# Patient Record
Sex: Male | Born: 1965 | Race: Black or African American | Hispanic: No | Marital: Married | Smoking: Never smoker
Health system: Southern US, Community
[De-identification: ages and names within clinical notes are randomized; demographics above are authoritative.]

## PROBLEM LIST (undated history)

## (undated) DIAGNOSIS — R748 Abnormal levels of other serum enzymes: Secondary | ICD-10-CM

## (undated) DIAGNOSIS — D696 Thrombocytopenia, unspecified: Secondary | ICD-10-CM

## (undated) DIAGNOSIS — M109 Gout, unspecified: Secondary | ICD-10-CM

## (undated) DIAGNOSIS — K7689 Other specified diseases of liver: Secondary | ICD-10-CM

## (undated) DIAGNOSIS — G250 Essential tremor: Secondary | ICD-10-CM

## (undated) DIAGNOSIS — E039 Hypothyroidism, unspecified: Secondary | ICD-10-CM

## (undated) DIAGNOSIS — R569 Unspecified convulsions: Secondary | ICD-10-CM

## (undated) HISTORY — DX: Abnormal levels of other serum enzymes: R74.8

## (undated) HISTORY — PX: CATARACT EXTRACTION: SUR2

## (undated) HISTORY — DX: Essential tremor: G25.0

## (undated) HISTORY — DX: Gout, unspecified: M10.9

## (undated) HISTORY — DX: Hypothyroidism, unspecified: E03.9

## (undated) HISTORY — DX: Other specified diseases of liver: K76.89

## (undated) HISTORY — DX: Thrombocytopenia, unspecified: D69.6

---

## 1997-10-31 ENCOUNTER — Ambulatory Visit (HOSPITAL_COMMUNITY): Admission: RE | Admit: 1997-10-31 | Discharge: 1997-10-31 | Payer: Self-pay | Admitting: Neurology

## 1997-11-14 ENCOUNTER — Encounter: Admission: RE | Admit: 1997-11-14 | Discharge: 1998-02-12 | Payer: Self-pay | Admitting: Unknown Physician Specialty

## 2000-04-04 ENCOUNTER — Ambulatory Visit (HOSPITAL_COMMUNITY): Admission: RE | Admit: 2000-04-04 | Discharge: 2000-04-04 | Payer: Self-pay | Admitting: Neurology

## 2000-04-04 ENCOUNTER — Encounter: Payer: Self-pay | Admitting: Neurology

## 2004-01-13 ENCOUNTER — Encounter: Admission: RE | Admit: 2004-01-13 | Discharge: 2004-01-13 | Payer: Self-pay | Admitting: Nephrology

## 2004-01-27 ENCOUNTER — Encounter: Admission: RE | Admit: 2004-01-27 | Discharge: 2004-01-27 | Payer: Self-pay | Admitting: Nephrology

## 2005-06-12 ENCOUNTER — Encounter: Payer: Self-pay | Admitting: *Deleted

## 2006-09-29 ENCOUNTER — Ambulatory Visit (HOSPITAL_COMMUNITY): Admission: RE | Admit: 2006-09-29 | Discharge: 2006-09-29 | Payer: Self-pay | Admitting: Ophthalmology

## 2006-11-10 ENCOUNTER — Ambulatory Visit (HOSPITAL_COMMUNITY): Admission: RE | Admit: 2006-11-10 | Discharge: 2006-11-10 | Payer: Self-pay | Admitting: Ophthalmology

## 2008-04-07 ENCOUNTER — Emergency Department (HOSPITAL_BASED_OUTPATIENT_CLINIC_OR_DEPARTMENT_OTHER): Admission: EM | Admit: 2008-04-07 | Discharge: 2008-04-08 | Payer: Self-pay | Admitting: Emergency Medicine

## 2008-09-11 ENCOUNTER — Emergency Department (HOSPITAL_BASED_OUTPATIENT_CLINIC_OR_DEPARTMENT_OTHER): Admission: EM | Admit: 2008-09-11 | Discharge: 2008-09-11 | Payer: Self-pay | Admitting: Emergency Medicine

## 2008-09-11 ENCOUNTER — Ambulatory Visit: Payer: Self-pay | Admitting: Diagnostic Radiology

## 2009-03-18 IMAGING — CR DG CHEST 2V
2 series · 2 of 2 positions shown · non-contrast
Comparison: None.

CLINICAL DATA: Pre-op cataract surgery.
 CHEST - 2 VIEW - 09/29/06:

[view not recorded (1 of 2)]
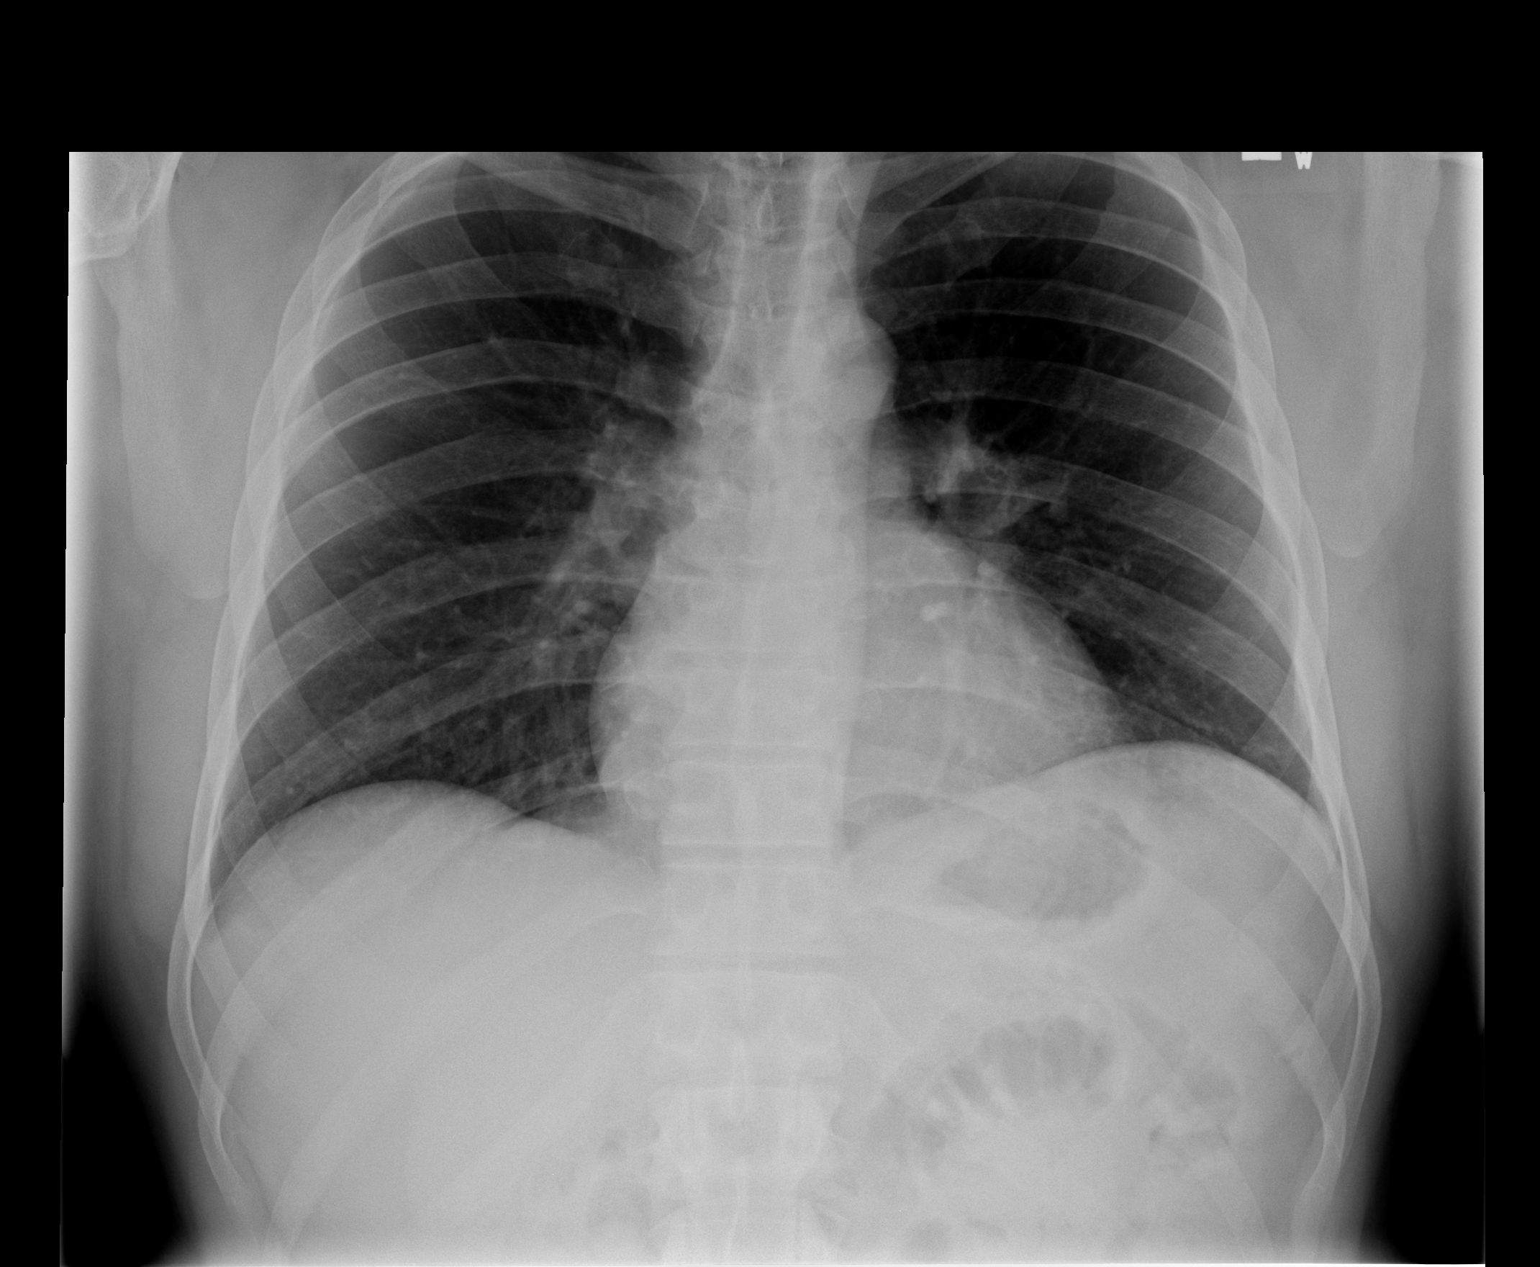

[view not recorded (2 of 2)]
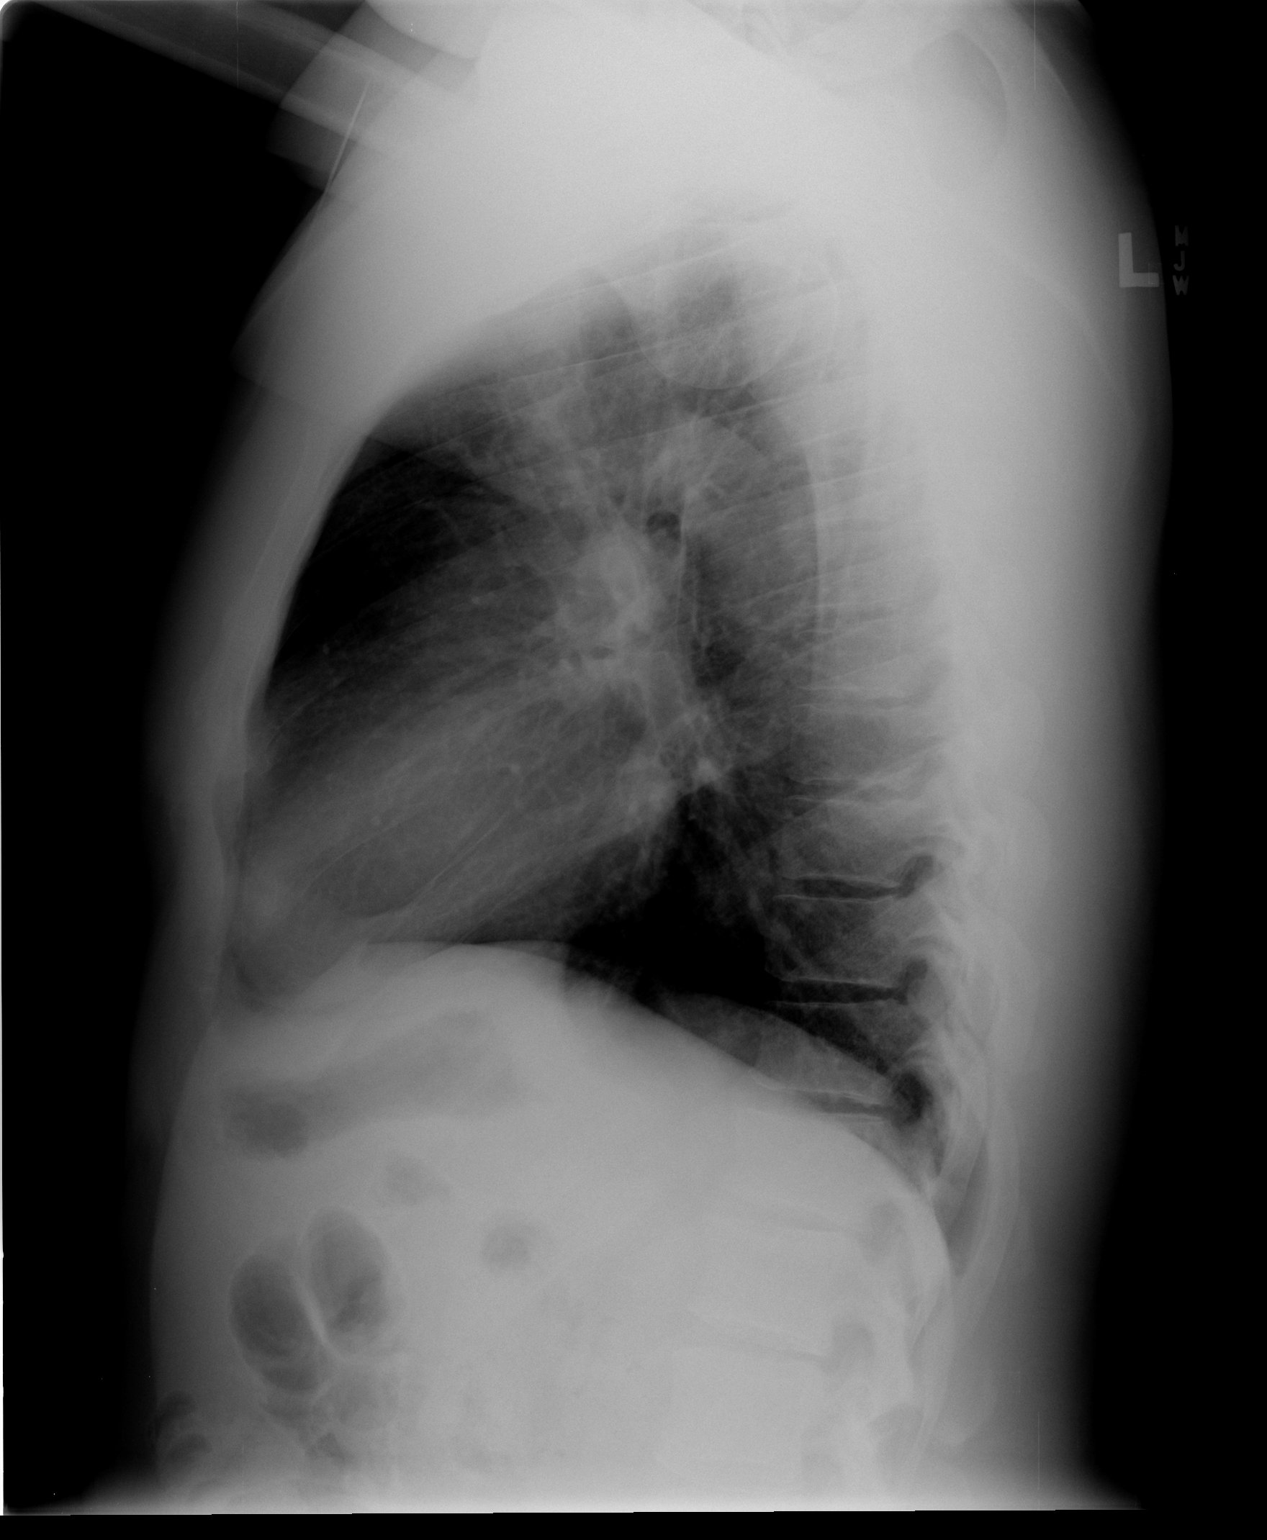

[2 of 2 positions shown; findings below may reference images not displayed]

FINDINGS: The trachea is midline.   The heart size is normal.   The lungs are clear.   No pleural fluid.
IMPRESSION: No acute findings.

## 2010-04-27 ENCOUNTER — Emergency Department (HOSPITAL_BASED_OUTPATIENT_CLINIC_OR_DEPARTMENT_OTHER)
Admission: EM | Admit: 2010-04-27 | Discharge: 2010-04-27 | Disposition: A | Discharge status: Home / Self Care | Payer: Commercial Managed Care - PPO | Attending: Emergency Medicine | Admitting: Emergency Medicine

## 2010-04-27 DIAGNOSIS — R319 Hematuria, unspecified: Secondary | ICD-10-CM | POA: Insufficient documentation

## 2010-04-27 DIAGNOSIS — E039 Hypothyroidism, unspecified: Secondary | ICD-10-CM | POA: Insufficient documentation

## 2010-04-27 DIAGNOSIS — N390 Urinary tract infection, site not specified: Secondary | ICD-10-CM | POA: Insufficient documentation

## 2010-04-27 DIAGNOSIS — N289 Disorder of kidney and ureter, unspecified: Secondary | ICD-10-CM | POA: Insufficient documentation

## 2010-04-27 LAB — BASIC METABOLIC PANEL
Chloride: 109 mEq/L (ref 96–112)
Creatinine, Ser: 1.8 mg/dL — ABNORMAL HIGH (ref 0.4–1.5)
GFR calc Af Amer: 50 mL/min — ABNORMAL LOW (ref >60)

## 2010-04-27 LAB — URINALYSIS, ROUTINE W REFLEX MICROSCOPIC
Glucose, UA: NEGATIVE mg/dL
Ketones, ur: 15 mg/dL — AB
Protein, ur: 100 mg/dL — AB

## 2010-04-27 LAB — URINE MICROSCOPIC-ADD ON

## 2010-04-28 LAB — URINE CULTURE

## 2010-05-19 ENCOUNTER — Emergency Department (HOSPITAL_BASED_OUTPATIENT_CLINIC_OR_DEPARTMENT_OTHER)
Admission: EM | Admit: 2010-05-19 | Discharge: 2010-05-19 | Disposition: A | Discharge status: Home / Self Care | Payer: 59 | Attending: Emergency Medicine | Admitting: Emergency Medicine

## 2010-05-19 DIAGNOSIS — L509 Urticaria, unspecified: Secondary | ICD-10-CM | POA: Insufficient documentation

## 2010-05-19 DIAGNOSIS — E039 Hypothyroidism, unspecified: Secondary | ICD-10-CM | POA: Insufficient documentation

## 2010-06-26 NOTE — Op Note (Signed)
NAME:  Charles Powers, MUZZY NO.:  [id number]   MEDICAL RECORD NO.:  [id number]          PATIENT TYPE:  AMB   LOCATION:  SDS                          FACILITY:  [facility]   PHYSICIAN:  Alford Highland. Rankin, M.D.   DATE OF BIRTH:  [birth year]   DATE OF PROCEDURE:  09/29/2006  DATE OF DISCHARGE:                               OPERATIVE REPORT   PREOPERATIVE DIAGNOSIS:  Dense nuclear sclerotic cataract of left eye  with passive subcapsular changes.   POSTOPERATIVE DIAGNOSIS:  Dense nuclear sclerotic cataract of left eye  with passive subcapsular changes.   PROCEDURE:  Phacoemulsification and extracapsular cataract extraction  with intraocular lens placement the left eye.   SURGEON:  Alford Highland. Rankin, M.D.   ANESTHESIA:  Local __________  control.   INDICATIONS FOR PROCEDURE:  The patient is a 45 year old man who has  profound vision loss in terms of activities of daily living at a level  of 2120/200 in the left eye base of medio passive from cataract.  The  patient understands this is an attempt to remove the cataract, as well  as to place an intraocular lens placement.  The patient understands the  risks of anesthesia including the risk of death and loss of the eye and  including but not limited to hemorrhage, infection, scarring, need for  further surgery, no change in vision, loss of vision, progression of  disease despite intervention.  Appropriate signed consent was obtained.  Patient was taken to the operating room.   DESCRIPTION OF PROCEDURE:  In the operating room, appropriate monitors  followed by mild sedation, 2% Xylocaine, no epinephrine was then placed  in the retrobulbar fashion.  Additional 5 mL was placed modified VanLint  laterally.  The left periocular region was sterilely prepped and draped  in the usual opthalmic fashion.  Lid speculum applied.  A diamond knife  was then used to create a keratotomy incision in the superonasal  quadrants.  Anterior chamber was  deepened with Viscoat.  A paracentesis  incision was made at the 2:30 position.  At this time a cystotome was  then used to engage the capsule and a circular chair capsular orexis  was started and was nearly completed.  There is a lateral rent, which  began at the 2:30 to 3 o'clock position.  A cystotome was then used to  redirect this and the capsulotomy was completed.  Hydrodissection and  delineation was then carried out in the back.  Notable findings that  there is a significant nuclear sclerotic hardening noted.  At this time  phacoemulsification was then carried out without difficulty.  Sectors  were isolated and the nucleus rotated, and were removed and finally the  central plate was elevated and removed without difficulty.  At this time  clean up with IA was carried out, although there was little of this.  This was mostly nuclear material.  At this time notable findings of  posterior subcapsular haze.  The decision was made not to engage this to  prevent and to avoid disruption of the capsule.   At this  time the Alcon SN60WF foldable lens was then placed into the  capsular bag.  Power +11.5, serial number is [id number].  This was  rotated into horizontal position and then subsequently into a position  centered at the 5 and 11 o'clock position with excellent capsular  support.  Miochol was then placed to confirm that this was in the  posterior chamber.  Thereafter a little bit of viscoelastic was left in  the anterior chamber to maintain the anterior chamber depth.   It was necessary to assist the closure and maintain closure of the  keratome incision using two separate 10-0 nylon sutures.  Knots were  rotated to the area.   At this time the wound was secure.  Subconjunctival Decadron applied.  Sterile patch and Fox shield applied.  The patient tolerated the  procedure well without complication.      Alford Highland Rankin, M.D.  Electronically Signed     GAR/MEDQ  D:   09/29/2006  T:  09/30/2006  Job:  [id number]

## 2010-06-26 NOTE — Op Note (Signed)
NAME:  TURKI, TAPANES NO.:  [id number]   MEDICAL RECORD NO.:  [id number]          PATIENT TYPE:  AMB   LOCATION:  SDS                          FACILITY:  [facility]   PHYSICIAN:  Alford Highland. Rankin, M.D.   DATE OF BIRTH:  [birth year]   DATE OF PROCEDURE:  11/10/2006  DATE OF DISCHARGE:                               OPERATIVE REPORT   PREOPERATIVE DIAGNOSIS:  Nuclear sclerotic cataract, right eye.   POSTOPERATIVE DIAGNOSIS:  Nuclear sclerotic cataract, right eye.   PROCEDURE:  Phaco, extracapsular cataract extraction with intraocular  lens placement - OD - posterior chamber.   SURGEON:  Alford Highland. Rankin, M.D.   ANESTHESIA:  Local, retrobulbar under anesthesia control.   LENS PLACED:  Alcon SN60WF, power +13.0, serial number [id number].   INDICATIONS FOR PROCEDURE:  The patient is a 45 year old man who has  profound vision loss in the left eye, high myopia, and developed a  nuclear sclerotic cataract.  The patient says he had that done in the  left eye some month and a half previously.  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  The patient understands the risks  of anesthesia including rare occurrence of death or loss of the eye  including but not limited to hemorrhage, infection, scarring, need for  further surgery, no change in vision, loss of vision and progressive  disease despite intervention.  Appropriate signed consent was obtained.   The patient was taken to the operating room.  In the operating room,  appropriate monitors followed by mild sedation.  1% Xylocaine no  epinephrine was then placed in retrobulbar fashion, 5 mL, followed by  additional 5 mL laterally in the fashion of  modified Darel Hong.  The  right periocular region was sterilely prepped and draped in usual  sterile fashion.  Lid speculum applied.  Diamond blade knife was then  used to create a keratotomy  incision superotemporally.  Anterior chamber  was deepened with Viscoat.  A cystotome needle was bent and  capsulorrhexis was fashioned with this is in continuous circular tear  fashion without difficulty.  The phacoemulsification tip was then placed  after hydrodissection and delineation had been carried out in the bag  with BSS.  Lens removal carried out uneventfully.  Separate paracentesis  incision was then fashioned as for placement of the nucleus rotator.  The nucleus was removed in segments, fractured in half, removed in  quadrants, and all nuclear material was removed in this fashion.  Irrigation-aspiration and then clean up of the cortex was then carried  out.  A bent IA instrument was then used superiorly.  All cortical  fragments had been removed.  At this time, the anterior chamber and the  capsule was then deepened with Viscoat.  At this time, the intraocular  lens was placed through the incision with the lens delivery system and  was placed in the capsular bag and rotated, with  excellent positioning  in the capsular bag.  IA  was then carried out to remove  the remnants of Viscoat.  A safety 10-0  nylon suture was placed.  No complications occurred.  Subconjunctival  Decadron placed inferiorly.  Soft patch and Fox shield applied.   The patient tolerated the procedure without complication.      Alford Highland Rankin, M.D.  Electronically Signed     GAR/MEDQ  D:  11/10/2006  T:  11/10/2006  Job:  [id number]

## 2010-11-22 LAB — BASIC METABOLIC PANEL
CO2: 23
Chloride: 111
Glucose, Bld: 91
Potassium: 4.1
Sodium: 142

## 2010-11-22 LAB — CBC
HCT: 45.1
Hemoglobin: 15.1
MCHC: 33.5
MCV: 88.1
RDW: 13.6

## 2010-11-23 LAB — BASIC METABOLIC PANEL
BUN: 18
Calcium: 9.3
GFR calc non Af Amer: 51 — ABNORMAL LOW
Glucose, Bld: 89

## 2010-11-23 LAB — PROTIME-INR: INR: 1

## 2010-11-23 LAB — CBC
MCHC: 34.4
Platelets: 123 — ABNORMAL LOW
RDW: 12.9

## 2010-11-23 LAB — APTT: aPTT: 32

## 2012-09-26 ENCOUNTER — Encounter (HOSPITAL_BASED_OUTPATIENT_CLINIC_OR_DEPARTMENT_OTHER): Payer: Self-pay | Admitting: *Deleted

## 2012-09-26 ENCOUNTER — Emergency Department (HOSPITAL_BASED_OUTPATIENT_CLINIC_OR_DEPARTMENT_OTHER)
Admission: EM | Admit: 2012-09-26 | Discharge: 2012-09-26 | Disposition: A | Discharge status: Home / Self Care | Payer: 59 | Attending: Emergency Medicine | Admitting: Emergency Medicine

## 2012-09-26 DIAGNOSIS — R229 Localized swelling, mass and lump, unspecified: Secondary | ICD-10-CM

## 2012-09-26 DIAGNOSIS — R569 Unspecified convulsions: Secondary | ICD-10-CM | POA: Insufficient documentation

## 2012-09-26 DIAGNOSIS — M799 Soft tissue disorder, unspecified: Secondary | ICD-10-CM | POA: Insufficient documentation

## 2012-09-26 DIAGNOSIS — Z79899 Other long term (current) drug therapy: Secondary | ICD-10-CM | POA: Insufficient documentation

## 2012-09-26 HISTORY — DX: Unspecified convulsions: R56.9

## 2012-09-26 NOTE — ED Provider Notes (Signed)
CSN: [id number]     Arrival date & time 09/26/12  2058 History     First MD Initiated Contact with Patient 09/26/12 2115     Chief Complaint  Patient presents with  . Recurrent Skin Infections   (Consider location/radiation/quality/duration/timing/severity/associated sxs/prior Treatment) HPI Comments: Patient is otherwise healthy 47 year old man with a history of recurrent skin infections - he comes in because he has noticed an nonpainful knot behind his left ear.  He reports no drainage, redness but just some swelling to the area.  Denies any injury to the area as well.  Patient is a 47 y.o. male presenting with rash. The history is provided by the patient. No language interpreter was used.  Rash Location:  Head/neck Head/neck rash location:  Scalp and L ear Quality: swelling   Quality: not blistering, not bruising, not burning, not draining, not dry, not itchy, not painful, not peeling, not red, not scaling and not weeping   Severity:  Mild Onset quality:  Gradual Duration:  2 days Timing:  Constant Progression:  Worsening Chronicity:  New Relieved by:  Nothing Worsened by:  Nothing tried Ineffective treatments:  None tried Associated symptoms: no abdominal pain, no diarrhea, no fever, no headaches, no induration, no myalgias, no nausea, no periorbital edema, no throat swelling, no tongue swelling, not vomiting and not wheezing     Past Medical History  Diagnosis Date  . Seizures    History reviewed. No pertinent past surgical history. History reviewed. No pertinent family history. History  Substance Use Topics  . Smoking status: Never Smoker   . Smokeless tobacco: Not on file  . Alcohol Use: No    Review of Systems  Constitutional: Negative for fever.  Respiratory: Negative for wheezing.   Gastrointestinal: Negative for nausea, vomiting, abdominal pain and diarrhea.  Musculoskeletal: Negative for myalgias.  Skin: Positive for rash.  Neurological: Negative for  headaches.  All other systems reviewed and are negative.    Allergies  Review of patient's allergies indicates no known allergies.  Home Medications   Current Outpatient Rx  Name  Route  Sig  Dispense  Refill  . divalproex (DEPAKOTE) 500 MG DR tablet   Oral   Take 1,000 mg by mouth 1 day or 1 dose.         . levothyroxine (SYNTHROID, LEVOTHROID) 75 MCG tablet   Oral   Take 75 mcg by mouth daily before breakfast.         . topiramate (TOPAMAX) 200 MG tablet   Oral   Take 100 mg by mouth 2 (two) times daily.          BP 120/78  Pulse 70  Temp(Src) 98.3 F (36.8 C) (Oral)  Resp 18  Ht 5\' 5"  (1.651 m)  Wt 200 lb (90.719 kg)  BMI 33.28 kg/m2  SpO2 99% Physical Exam  Nursing note and vitals reviewed. Constitutional: He is oriented to person, place, and time. He appears well-developed and well-nourished. No distress.  HENT:  Head: Normocephalic and atraumatic.  Right Ear: External ear normal.  Nose: Nose normal.  Mouth/Throat: Oropharynx is clear and moist. No oropharyngeal exudate.  Small soft tissue swelling post auricular - no mastoid process tenderness - mild induration without fluctuance  Eyes: Conjunctivae are normal. Pupils are equal, round, and reactive to light. No scleral icterus.  Neck: Normal range of motion. Neck supple.  Cardiovascular: Normal rate, regular rhythm and normal heart sounds.  Exam reveals no gallop and no friction rub.  No murmur heard. Pulmonary/Chest: Effort normal and breath sounds normal. No respiratory distress. He has no wheezes.  Abdominal: Soft. Bowel sounds are normal. He exhibits no distension. There is no tenderness.  Musculoskeletal: Normal range of motion. He exhibits no edema and no tenderness.  Lymphadenopathy:    He has no cervical adenopathy.  Neurological: He is alert and oriented to person, place, and time. No cranial nerve deficit.  Skin: Skin is warm and dry. No rash noted. No erythema. No pallor.  Psychiatric: He  has a normal mood and affect. His behavior is normal. Judgment and thought content normal.    ED Course   Procedures (including critical care time)  Labs Reviewed - No data to display No results found. 1. Soft tissue swelling     MDM  Patient with small amount of left post auricular soft tissue swelling - this is non=painful so will plan on warm compresses and watchful waiting.  Patient and family in agreement with plan.  Izola Price Marisue Humble, [place] 09/26/12 2203

## 2012-09-26 NOTE — ED Notes (Signed)
Patient states that he has a "boil" on the back of his left ear and just wants it checked out.  Denies any pain.

## 2012-09-26 NOTE — ED Provider Notes (Signed)
Medical screening examination/treatment/procedure(s) were performed by non-physician practitioner and as supervising physician I was immediately available for consultation/collaboration.   Charles B. Bernette Mayers, MD 09/26/12 2245

## 2012-11-04 ENCOUNTER — Telehealth: Payer: Self-pay | Admitting: Neurology

## 2012-11-06 NOTE — Telephone Encounter (Signed)
Called patient to find out what lab order needed. No answer.

## 2012-11-10 NOTE — Telephone Encounter (Signed)
I called and LMVM for wife at number left 11-09-12 re: labs requested.   Will call back if needed.

## 2012-11-19 ENCOUNTER — Telehealth: Payer: Self-pay | Admitting: Neurology

## 2012-11-20 NOTE — Telephone Encounter (Signed)
Spoke to spouse. The patient is a formed Dr. Sandria Manly patient.  Answered questions about patient's new assignment to Dr. Marjory Lies and how she could have PCP labs fwd to our office.

## 2012-12-14 ENCOUNTER — Telehealth: Payer: Self-pay | Admitting: *Deleted

## 2012-12-14 NOTE — Telephone Encounter (Signed)
Calling for appt

## 2012-12-15 ENCOUNTER — Encounter (INDEPENDENT_AMBULATORY_CARE_PROVIDER_SITE_OTHER): Payer: Self-pay

## 2012-12-15 ENCOUNTER — Ambulatory Visit (INDEPENDENT_AMBULATORY_CARE_PROVIDER_SITE_OTHER): Payer: 59 | Admitting: Diagnostic Neuroimaging

## 2012-12-15 ENCOUNTER — Encounter: Payer: Self-pay | Admitting: Diagnostic Neuroimaging

## 2012-12-15 VITALS — BP 108/72 | HR 62 | Ht 65.0 in | Wt 203.0 lb

## 2012-12-15 DIAGNOSIS — G40909 Epilepsy, unspecified, not intractable, without status epilepticus: Secondary | ICD-10-CM

## 2012-12-15 NOTE — Patient Instructions (Signed)
Continue current medications. 

## 2012-12-15 NOTE — Progress Notes (Signed)
[facility]  PATIENT: Charles Powers DOB: Dec 11, 1965  REFERRING CLINICIAN:  HISTORY FROM: patient and wife REASON FOR VISIT: follow up   HISTORICAL  CHIEF COMPLAINT:  Chief Complaint  Patient presents with  . Follow-up    Prior Dr. Sandria Manly pt, Epilepsy    HISTORY OF PRESENT ILLNESS:   UPDATE 12/15/12: Since last visit patient is doing well. No further seizures. Last seizure was in July 2010. Patient is on Depakote ER 500 mg twice a day plus Depakote ER 250mg  at bedtime. He is also on Topamax 100 mg twice a day. Patient continues with fatigue, daytime sleepiness, bilateral upper extremity tremor.  PRIOR HPI (03/31/12, Dr. Sandria Manly): 47 year old right-handed African American married male from [place], [place] with a history of early morning nocturnal generalized major motor seizures beginning in April 1994. At that time his neurologic examination, sleep deprived EEGs, and  MRI study of brain were normal. After having 2 seizures and normal EEGs he was placed on Dilantin medication but developed elevated liver function tests and in May of 1996 was changed to Depakote. His last CT 10/31/1997 was normal. 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 His weight ballooned on Depakote, 45 pounds to 211 pounds, then back down to 189 and now 211.5 pounds. He had episodes suggestive of myoclonic jerks. In 2002 he was begun on Topamax in addition to the Depakote. He has had generalized major motor seizures, 2 in 1994, one 11/04/1996, one 02/10/2000, one 03/18/2000,  one 2008, and one 09/07/2008. On Topamax he developed elevated creatinine and was seen by Dr. Geanie Berlin nephrologist. His last MRI study of the brain 04/04/2000  was normal with and without contrast. His last DEXA scan  09/12/2005  was normal. Blood studies 09/21/10=  WBC 7600, Hemoglobin 15.5, PLTC 91 K, sodium 141, potassium 4.7, chloride 111, CO2 28, BUN 16, Creatinine 1.6. Glucose 78, Valproic acid level 114.2, TSH  6.83. Repeat TSH 11/02/10, 3.08. CT scan of the brain without contrast was normal 09/07/2008. Medication schedule is Depakote ER brand name 500 mg 2 twice daily and  Depakote ER 250 mg one in the evening,Topamax brand name 100  milligrams one twice daily, and Synthroid 25 mcg once daily. Dr.Coladonato  does not feel his elevated creatinine is due to Topamax. patient denies any new symptoms. He denies dj vu, memory loss, hallucinations, delusions, depression, anxiety, or apathy . He does not exercise on a regular basis. Last blood levels 07/24/2011 were topiramate 7.6 and valproic acid 128. 09/19/2011  CBC and CMP were normal except platelet count 102K. He snores at night and he underwent polysomnogram 9/10/200  which was normal.   REVIEW OF SYSTEMS: Full 14 system review of systems performed and notable only for tremor.  ALLERGIES: No Known Allergies  HOME MEDICATIONS: Outpatient Prescriptions Prior to Visit  Medication Sig Dispense Refill  . divalproex (DEPAKOTE) 500 MG DR tablet Take 1,000 mg by mouth 1 day or 1 dose.      . levothyroxine (SYNTHROID, LEVOTHROID) 75 MCG tablet Take 75 mcg by mouth daily before breakfast.      . topiramate (TOPAMAX) 200 MG tablet Take 100 mg by mouth 2 (two) times daily.       No facility-administered medications prior to visit.    PAST MEDICAL HISTORY: Past Medical History  Diagnosis Date  . Seizures   . Essential tremor   . Hypothyroidism   . Elevated creatine kinase   . Temporary low platelet count   .  Liver dysfunction     on Dilantin    PAST SURGICAL HISTORY: Past Surgical History  Procedure Laterality Date  . Cataract extraction      FAMILY HISTORY: Family History  Problem Relation Age of Onset  . Diabetes Mother   . Hypertension Mother   . Pancreatic cancer Father   . Epilepsy    . Cancer      SOCIAL HISTORY:  History   Social History  . Marital Status: Married    Spouse Name: Rosey Bath    Number of Children: 0  . Years of  Education: BA   Occupational History  .  [employer]   Social History Main Topics  . Smoking status: Never Smoker   . Smokeless tobacco: Never Used  . Alcohol Use: No  . Drug Use: No  . Sexual Activity: Not on file   Other Topics Concern  . Not on file   Social History Narrative   Patient lives at home with his spouse.   Caffeine Use: occasionally     PHYSICAL EXAM  Filed Vitals:   12/15/12 1342  BP: 108/72  Pulse: 62  Height: 5\' 5"  (1.651 m)  Weight: 203 lb (92.08 kg)    Not recorded    Body mass index is 33.78 kg/(m^2).  GENERAL EXAM: Patient is in no distress  CARDIOVASCULAR: Regular rate and rhythm, no murmurs, no carotid bruits  NEUROLOGIC: MENTAL STATUS: awake, alert, language fluent, comprehension intact, naming intact CRANIAL NERVE: no papilledema on fundoscopic exam, pupils equal and reactive to light, visual fields full to confrontation, extraocular muscles intact, no nystagmus, facial sensation and strength symmetric, uvula midline, shoulder shrug symmetric, tongue midline. MOTOR: normal bulk and tone, full strength in the BUE, BLE; POSTURAL TREMOR OF BUE.  SENSORY: normal and symmetric to light touch, temperature, vibration COORDINATION: finger-nose-finger, fine finger movements normal REFLEXES: deep tendon reflexes present and symmetric GAIT/STATION: narrow based gait; romberg is negative   DIAGNOSTIC DATA (LABS, IMAGING, TESTING) - I reviewed patient records, labs, notes, testing and imaging myself where available.  Lab Results  Component Value Date   WBC 4.2 11/10/2006   HGB 15.1 11/10/2006   HCT 45.1 11/10/2006   MCV 88.1 11/10/2006   PLT 117* 11/10/2006      Component Value Date/Time   NA 144 04/27/2010 0341   K 4.3 04/27/2010 0341   CL 109 04/27/2010 0341   CO2 25 04/27/2010 0341   GLUCOSE 104* 04/27/2010 0341   BUN 19 04/27/2010 0341   CREATININE 1.8* 04/27/2010 0341   CALCIUM 9.3 04/27/2010 0341   GFRNONAA 41* 04/27/2010 0341    GFRAA  Value: 50        The eGFR has been calculated using the MDRD equation. This calculation has not been validated in all clinical situations. eGFR's persistently <60 mL/min signify possible Chronic Kidney Disease.* 04/27/2010 0341   No results found for this basename: CHOL, HDL, LDLCALC, LDLDIRECT, TRIG, CHOLHDL   No results found for this basename: HGBA1C   No results found for this basename: VITAMINB12   No results found for this basename: TSH    09/15/08 EEG - right central temporal epileptiform activity C4, T4   ASSESSMENT AND PLAN  46 y.o. 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 Doing well on Depakote ER + Topamax.  PLAN: - continue current medications - review labs - repeat sleep study (witnessed apneas, snoring, obesity and day time fatigue)  Return in about 6 months (around 06/14/2013) for with Heide Guile or Lilliemae Fruge.    Suanne Marker, MD 12/15/2012, 2:34 PM Certified in Neurology, Neurophysiology and Neuroimaging  [facility] [address], [address] [place], [place] [address] [phone]

## 2013-01-05 ENCOUNTER — Telehealth: Payer: Self-pay | Admitting: Diagnostic Neuroimaging

## 2013-01-06 ENCOUNTER — Telehealth: Payer: Self-pay | Admitting: *Deleted

## 2013-01-06 MED ORDER — DIVALPROEX SODIUM ER 250 MG PO TB24: 250.0000 mg | ORAL_TABLET | Freq: Every evening | ORAL | Status: DC

## 2013-01-06 MED ORDER — TOPIRAMATE 100 MG PO TABS: 100.0000 mg | ORAL_TABLET | Freq: Two times a day (BID) | ORAL | Status: DC

## 2013-01-06 MED ORDER — DIVALPROEX SODIUM ER 500 MG PO TB24: 1000.0000 mg | ORAL_TABLET | Freq: Two times a day (BID) | ORAL | Status: DC

## 2013-01-06 NOTE — Telephone Encounter (Signed)
Although this message was typed yesterday, the person did not forward the message.  Message was forwarded today.  Rx's have been sent.  I called the patient.  They are aware.

## 2013-01-06 NOTE — Telephone Encounter (Signed)
I called pt and was not able to get pt at home or cell.  Lab results received.  Dr. Frances Furbish consulted re: depakote level 131.  Pts sx of fatigue, daytime sleepiness, bil. upper ext. tremor.  Will try to reach another time.

## 2013-01-11 ENCOUNTER — Encounter: Payer: Self-pay | Admitting: *Deleted

## 2013-01-11 NOTE — Telephone Encounter (Signed)
Attempted to call pt cell # (VM full). And home # is fax.?  Mailed letter re: pt attempted to reach, please call for lab results and recommendations.

## 2013-03-08 ENCOUNTER — Telehealth: Payer: Self-pay | Admitting: Diagnostic Neuroimaging

## 2013-03-08 NOTE — Telephone Encounter (Signed)
Patient has been scheduled for sooner appt w/ Dr Marjory LiesPenumalli

## 2013-03-08 NOTE — Telephone Encounter (Signed)
Called stating that Pt's tremors in hands have gotten worse.  They stated that some days are better than others but lately has had a hard time writing or counting money.  Asked if they could get in to see Dr. Marjory LiesPenumalli sometime this week.  They are available all day today, after 3 PM on Tuesday and Wednesday morning.  Please call.  Thank you.

## 2013-03-11 ENCOUNTER — Ambulatory Visit (INDEPENDENT_AMBULATORY_CARE_PROVIDER_SITE_OTHER): Payer: BC Managed Care – PPO | Admitting: Diagnostic Neuroimaging

## 2013-03-11 ENCOUNTER — Encounter (INDEPENDENT_AMBULATORY_CARE_PROVIDER_SITE_OTHER): Payer: Self-pay

## 2013-03-11 ENCOUNTER — Encounter: Payer: Self-pay | Admitting: Diagnostic Neuroimaging

## 2013-03-11 VITALS — BP 117/82 | HR 89 | Ht 65.0 in | Wt 203.0 lb

## 2013-03-11 DIAGNOSIS — R259 Unspecified abnormal involuntary movements: Secondary | ICD-10-CM

## 2013-03-11 DIAGNOSIS — R251 Tremor, unspecified: Secondary | ICD-10-CM

## 2013-03-11 DIAGNOSIS — G40909 Epilepsy, unspecified, not intractable, without status epilepticus: Secondary | ICD-10-CM

## 2013-03-11 NOTE — Progress Notes (Signed)
[facility]  PATIENT: Charles Powers DOB: 10/23/1965  REFERRING CLINICIAN:  HISTORY FROM: patient and wife REASON FOR VISIT: follow up   HISTORICAL  CHIEF COMPLAINT:  Chief Complaint  Patient presents with  . Follow-up    tremors    HISTORY OF PRESENT ILLNESS:   UPDATE 03/11/13: Since last visit, has been having more tremors, with handwriting and eating. This fluctuates. No clear pattern. 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   UPDATE 12/15/12: Since last visit patient is doing well. No further seizures. Last seizure was in July 2010. Patient is on Depakote ER 500 mg twice a day plus Depakote ER $RemoveBef'250mg'oWIwcNUEpF$  at bedtime. He is also on Topamax 100 mg twice a day. Patient continues with fatigue, daytime sleepiness, bilateral upper extremity tremor.  PRIOR HPI (03/31/12, Dr. Erling Cruz): 48 year old right-handed African American married male from [place], [place] with a history of early morning nocturnal generalized major motor seizures beginning in April 1994. At that time his neurologic examination, sleep deprived EEGs, and  MRI study of brain were normal. After having 2 seizures and normal EEGs he was placed on Dilantin medication but developed elevated liver function tests and in May of 1996 was changed to Depakote. His last CT 10/31/1997 was normal. 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 His weight ballooned on Depakote, 45 pounds to 211 pounds, then back down to 189 and now 211.5 pounds. He had episodes suggestive of myoclonic jerks. In 2002 he was begun on Topamax in addition to the Depakote. He has had generalized major motor seizures, 2 in 1994, one 11/04/1996, one 02/10/2000, one 03/18/2000,  one 2008, and one 09/07/2008. On Topamax he developed elevated creatinine and was seen by Dr. Servando Salina nephrologist. His last MRI study of the brain 04/04/2000  was normal with and without contrast. His last  DEXA scan  09/12/2005  was normal. Blood studies 09/21/10=  WBC 7600, Hemoglobin 15.5, PLTC 91 K, sodium 141, potassium 4.7, chloride 111, CO2 28, BUN 16, Creatinine 1.6. Glucose 78, Valproic acid level 114.2, TSH 6.83. Repeat TSH 11/02/10, 3.08. CT scan of the brain without contrast was normal 09/07/2008. Medication schedule is Depakote ER brand name 500 mg 2 twice daily and  Depakote ER 250 mg one in the evening,Topamax brand name 100  milligrams one twice daily, and Synthroid 25 mcg once daily. Dr.Coladonato  does not feel his elevated creatinine is due to Topamax. patient denies any new symptoms. He denies dj vu, memory loss, hallucinations, delusions, depression, anxiety, or apathy . He does not exercise on a regular basis. Last blood levels 07/24/2011 were topiramate 7.6 and valproic acid 128. 09/19/2011  CBC and CMP were normal except platelet count 102K. He snores at night and he underwent polysomnogram 9/10/200  which was normal.   REVIEW OF SYSTEMS: Full 14 system review of systems performed and notable only for tremor.  ALLERGIES: No Known Allergies  HOME MEDICATIONS: Outpatient Prescriptions Prior to Visit  Medication Sig Dispense Refill  . Calcium Carbonate-Vitamin D (CALCIUM-VITAMIN D) 500-200 MG-UNIT per tablet Take 1 tablet by mouth 2 (two) times daily with a meal.      . divalproex (DEPAKOTE ER) 500 MG 24 hr tablet Take 2 tablets (1,000 mg total) by mouth 2 (two) times daily.  360 tablet  1  . levothyroxine (SYNTHROID, LEVOTHROID) 75 MCG tablet Take 75 mcg by mouth daily before breakfast.      .  topiramate (TOPAMAX) 100 MG tablet Take 1 tablet (100 mg total) by mouth 2 (two) times daily.  180 tablet  1  . divalproex (DEPAKOTE ER) 250 MG 24 hr tablet Take 1 tablet (250 mg total) by mouth every evening.  90 tablet  1   No facility-administered medications prior to visit.    PAST MEDICAL HISTORY: Past Medical History  Diagnosis Date  . Seizures   . Essential tremor   . Hypothyroidism    . Elevated creatine kinase   . Temporary low platelet count   . Liver dysfunction     on Dilantin    PAST SURGICAL HISTORY: Past Surgical History  Procedure Laterality Date  . Cataract extraction      FAMILY HISTORY: Family History  Problem Relation Age of Onset  . Diabetes Mother   . Hypertension Mother   . Pancreatic cancer Father   . Epilepsy    . Cancer      SOCIAL HISTORY:  History   Social History  . Marital Status: Married    Spouse Name: Helene Kelp    Number of Children: 0  . Years of Education: BA   Occupational History  .  [employer]   Social History Main Topics  . Smoking status: Never Smoker   . Smokeless tobacco: Never Used  . Alcohol Use: No  . Drug Use: No  . Sexual Activity: Not on file   Other Topics Concern  . Not on file   Social History Narrative   Patient lives at home with his spouse.   Caffeine Use: occasionally     PHYSICAL EXAM  Filed Vitals:   03/11/13 1455  BP: 117/82  Pulse: 89  Height: $Remove'5\' 5"'qfHOUpI$  (1.651 m)  Weight: 203 lb (92.08 kg)    Not recorded    Body mass index is 33.78 kg/(m^2).  GENERAL EXAM: Patient is in no distress  CARDIOVASCULAR: Regular rate and rhythm, no murmurs, no carotid bruits  NEUROLOGIC: MENTAL STATUS: awake, alert, language fluent, comprehension intact, naming intact CRANIAL NERVE: no papilledema on fundoscopic exam, pupils equal and reactive to light, visual fields full to confrontation, extraocular muscles intact, no nystagmus, facial sensation and strength symmetric, uvula midline, shoulder shrug symmetric, tongue midline. MOTOR: normal bulk and tone, full strength in the BUE, BLE; POSTURAL AND ACTION TREMOR OF BUE.  SENSORY: normal and symmetric to light touch, temperature, vibration COORDINATION: finger-nose-finger, fine finger movements normal REFLEXES: deep tendon reflexes present and symmetric GAIT/STATION: narrow based gait; romberg is negative   DIAGNOSTIC DATA (LABS,  IMAGING, TESTING) - I reviewed patient records, labs, notes, testing and imaging myself where available.  Lab Results  Component Value Date   WBC 4.2 11/10/2006   HGB 15.1 11/10/2006   HCT 45.1 11/10/2006   MCV 88.1 11/10/2006   PLT 117* 11/10/2006      Component Value Date/Time   NA 144 04/27/2010 0341   K 4.3 04/27/2010 0341   CL 109 04/27/2010 0341   CO2 25 04/27/2010 0341   GLUCOSE 104* 04/27/2010 0341   BUN 19 04/27/2010 0341   CREATININE 1.8* 04/27/2010 0341   CALCIUM 9.3 04/27/2010 0341   GFRNONAA 41* 04/27/2010 0341   GFRAA  Value: 50        The eGFR has been calculated using the MDRD equation. This calculation has not been validated in all clinical situations. eGFR's persistently <60 mL/min signify possible Chronic Kidney Disease.* 04/27/2010 0341   No results found for this basename: CHOL,  HDL,  LDLCALC,  LDLDIRECT,  TRIG,  CHOLHDL   No results found for this basename: HGBA1C   No results found for this basename: VITAMINB12   No results found for this basename: TSH    09/15/08 EEG - right central temporal epileptiform activity C4, T4   ASSESSMENT AND PLAN  48 y.o. 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 Doing well on Depakote ER + Topamax from seizure control standpoint, but having increasing tremors. Recent VPA level 131 (11/27/12). Previously was 128 (07/24/11).   PLAN: - stop nightime depakote $RemoveBeforeDEI'250mg'FdkJcGBOrMIBAOEp$  qhs - continue depakote $RemoveBeforeDEI'1000mg'ZkyJDSBEwyutcmTn$  BID + TPX $Rem'100mg'BVlj$  BID - check labs in 1 month   Return in about 6 months (around 09/08/2013) for with Charlott Holler or Penumalli.    Penni Bombard, MD 0/92/3300, 7:62 PM Certified in Neurology, Neurophysiology and Neuroimaging  [facility] [address], [place], [place] [address] [phone]

## 2013-03-15 DIAGNOSIS — R251 Tremor, unspecified: Secondary | ICD-10-CM | POA: Insufficient documentation

## 2013-03-15 DIAGNOSIS — G40909 Epilepsy, unspecified, not intractable, without status epilepticus: Secondary | ICD-10-CM | POA: Insufficient documentation

## 2013-05-18 ENCOUNTER — Telehealth: Payer: Self-pay | Admitting: Diagnostic Neuroimaging

## 2013-05-18 NOTE — Telephone Encounter (Signed)
Called pt's wife Rosey Batheresa to inform her that we have not received the lab work results and if she could have them faxed the results over to us that we would appreciate it. Pt's wife verbalized understanding.

## 2013-05-18 NOTE — Telephone Encounter (Signed)
Pt's wife, Rosey Batheresa, called.  She stated that her husband had lab work done on 05-08-13 at [facility]. She asked if we have received the results from them. If we have they would like someone to call them with the results.  If we don't have them, she asked if we could request them. She stated that Dr. Marjory LiesPenumalli gave them an order for the lab work so they could go to a different location to have the lab work performed.  Please call her at [phone] to discuss.  Thank you

## 2013-05-20 ENCOUNTER — Telehealth: Payer: Self-pay | Admitting: Diagnostic Neuroimaging

## 2013-05-20 NOTE — Telephone Encounter (Signed)
Pt's wife, Rosey Batheresa, called in.  She stated that she called the hospital on Tuesday and they were faxing over the lab work results.  She wanted to make sure that we have received them and if someone could call her to give her those results. Please reference previous telephone message dated 05-18-13.  Thank you

## 2013-05-24 NOTE — Telephone Encounter (Addendum)
Called [facility]. Requested labs faxed again. Received lab results. Valporic(96.7) and Ammonia (55) levels normal. Dr. Marjory LiesPenumalli recommended repeat CBC approx. 06/11/13. Explained to spouse, agreed.

## 2013-06-14 ENCOUNTER — Ambulatory Visit: Payer: 59 | Admitting: Diagnostic Neuroimaging

## 2013-06-15 ENCOUNTER — Telehealth: Payer: Self-pay | Admitting: Diagnostic Neuroimaging

## 2013-06-15 NOTE — Telephone Encounter (Signed)
[facility] the [facility], [facility]  He ran the Rx's and said the reject only says the patient needs to contact the ins.  He says they likely want the patient to be on generic medication.  I contacted the ins and submitted all clinical info to try and see if they could grant an exception.  I called the patient back.  Spoke with Charles Powers.  Gave her the phone number for assistance for Depakote [phone]) and Topamax [phone]).  She will contact them and call us back if needed. She was very appreciative for this info.

## 2013-06-15 NOTE — Telephone Encounter (Signed)
Patient's wife calling to state that patient is having difficulties with insurance and his Depakote and Topamax medications. Patient's wife states the medications are more expensive now and they are requesting samples of both until they are able to sort out insurance issues. Please call and advise.

## 2013-06-16 ENCOUNTER — Telehealth: Payer: Self-pay | Admitting: Diagnostic Neuroimaging

## 2013-06-16 NOTE — Telephone Encounter (Signed)
I spoke with Ms. Charles Powers  She wanted to let me know the programs said they should be able to help them get the meds.

## 2013-06-16 NOTE — Telephone Encounter (Signed)
Pt's wife Rosey Batheresa called concerning phone call from yesterday, she would like for someone to call her back concerning this matter. Thanks

## 2013-08-09 ENCOUNTER — Other Ambulatory Visit: Payer: Self-pay

## 2013-08-09 MED ORDER — DIVALPROEX SODIUM ER 500 MG PO TB24: 1000.0000 mg | ORAL_TABLET | Freq: Two times a day (BID) | ORAL | Status: DC

## 2013-08-09 MED ORDER — TOPIRAMATE 100 MG PO TABS: 100.0000 mg | ORAL_TABLET | Freq: Two times a day (BID) | ORAL | Status: DC

## 2013-09-08 ENCOUNTER — Ambulatory Visit: Payer: PRIVATE HEALTH INSURANCE | Admitting: Diagnostic Neuroimaging

## 2013-10-06 ENCOUNTER — Ambulatory Visit: Payer: BC Managed Care – PPO | Admitting: Diagnostic Neuroimaging

## 2013-10-12 ENCOUNTER — Other Ambulatory Visit: Payer: Self-pay | Admitting: Diagnostic Neuroimaging

## 2013-10-26 ENCOUNTER — Ambulatory Visit (INDEPENDENT_AMBULATORY_CARE_PROVIDER_SITE_OTHER): Payer: PRIVATE HEALTH INSURANCE | Admitting: Diagnostic Neuroimaging

## 2013-10-26 ENCOUNTER — Encounter (INDEPENDENT_AMBULATORY_CARE_PROVIDER_SITE_OTHER): Payer: Self-pay

## 2013-10-26 ENCOUNTER — Encounter: Payer: Self-pay | Admitting: Diagnostic Neuroimaging

## 2013-10-26 VITALS — BP 120/82 | HR 64 | Ht 65.0 in | Wt 201.6 lb

## 2013-10-26 DIAGNOSIS — G40909 Epilepsy, unspecified, not intractable, without status epilepticus: Secondary | ICD-10-CM

## 2013-10-26 NOTE — Progress Notes (Signed)
[facility]  PATIENT: Charles Powers DOB: 08/22/1965  REFERRING CLINICIAN:  HISTORY FROM: patient and wife REASON FOR VISIT: follow up   HISTORICAL  CHIEF COMPLAINT:  Chief Complaint  Patient presents with  . Follow-up    Sz d/o    HISTORY OF PRESENT ILLNESS:   UPDATE 10/26/13: Since last visit, tremors have resolved. Now on lower dose of depakote ER. Continues on TPX. No seizures. Doing well.   UPDATE 03/11/13: Since last visit, has been having more tremors, with handwriting and eating. This fluctuates. No clear pattern. 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   UPDATE 12/15/12: Since last visit patient is doing well. No further seizures. Last seizure was in July 2010. Patient is on Depakote ER 500 mg twice a day plus Depakote ER 234m at bedtime. He is also on Topamax 100 mg twice a day. Patient continues with fatigue, daytime sleepiness, bilateral upper extremity tremor.  PRIOR HPI (03/31/12, Dr. LErling Cruz: 48[place] right[place] early [place] nocturnal generalized major motor seizures beginning in April 1994. At that time his neurologic examination, sleep deprived EEGs, and  MRI study of brain were normal. After having 2 seizures and normal EEGs he was placed on Dilantin medication but developed elevated liver function tests and in May of 1996 was changed to Depakote. His last CT 10/31/1997 was normal. 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 His weight ballooned on Depakote, 45 pounds to 211 pounds, then back down to 189 and now 211.5 pounds. He had episodes suggestive of myoclonic jerks. In 2002 he was begun on Topamax in addition to the Depakote. He has had generalized major motor seizures, 2 in 1994, one 11/04/1996, one 02/10/2000, one 03/18/2000,  one 2008, and one 09/07/2008. On Topamax he developed elevated  creatinine and was seen by Dr. CServando Salinanephrologist. His last MRI study of the brain 04/04/2000  was normal with and without contrast. His last DEXA scan  09/12/2005  was normal. Blood studies 09/21/10=  WBC 7600, Hemoglobin 15.5, PLTC 91 K, sodium 141, potassium 4.7, chloride 111, CO2 28, BUN 16, Creatinine 1.6. Glucose 78, Valproic acid level 114.2, TSH 6.83. Repeat TSH 11/02/10, 3.08. CT scan of the brain without contrast was normal 09/07/2008. Medication schedule is Depakote ER brand name 500 mg 2 twice daily and  Depakote ER 250 mg one in the evening,Topamax brand name 100  milligrams one twice daily, and Synthroid 25 mcg once daily. Dr.Coladonato  does not feel his elevated creatinine is due to Topamax. patient denies any new symptoms. He denies dj vu, memory loss, hallucinations, delusions, depression, anxiety, or apathy . He does not exercise on a regular basis. Last blood levels 07/24/2011 were topiramate 7.6 and valproic acid 128. 09/19/2011  CBC and CMP were normal except platelet count 102K. He snores at night and he underwent polysomnogram 9/10/200  which was normal.   REVIEW OF SYSTEMS: Full 14 system review of systems performed and notable only for h/o seizure.   ALLERGIES: No Known Allergies  HOME MEDICATIONS: Outpatient Prescriptions Prior to Visit  Medication Sig Dispense Refill  . DEPAKOTE ER 500 MG 24 hr tablet TAKE 2 TABLETS BY MOUTH 2 TIMES A DAY.  120 tablet  1  . levothyroxine (SYNTHROID, LEVOTHROID) 75 MCG tablet Take 75 mcg by mouth daily before breakfast.      . TOPAMAX 100  MG tablet TAKE 1 TABLET BY MOUTH 2 TIMES A DAY.  60 tablet  1  . Calcium Carbonate-Vitamin D (CALCIUM-VITAMIN D) 500-200 MG-UNIT per tablet Take 1 tablet by mouth 2 (two) times daily with a meal.       No facility-administered medications prior to visit.    PAST MEDICAL HISTORY: Past Medical History  Diagnosis Date  . Seizures   . Essential tremor   . Hypothyroidism   . Elevated creatine kinase   .  Temporary low platelet count   . Liver dysfunction     on Dilantin    PAST SURGICAL HISTORY: Past Surgical History  Procedure Laterality Date  . Cataract extraction      FAMILY HISTORY: Family History  Problem Relation Age of Onset  . Diabetes Mother   . Hypertension Mother   . Pancreatic cancer Father   . Epilepsy    . Cancer      SOCIAL HISTORY:  History   Social History  . Marital Status: Married    Spouse Name: Helene Kelp    Number of Children: 0  . Years of Education: BA   Occupational History  .  [employer]   Social History Main Topics  . Smoking status: Never Smoker   . Smokeless tobacco: Never Used  . Alcohol Use: No  . Drug Use: No  . Sexual Activity: Not on file   Other Topics Concern  . Not on file   Social History Narrative   Patient lives at home with his spouse.   Caffeine Use: occasionally     PHYSICAL EXAM  Filed Vitals:   10/26/13 1330  BP: 120/82  Pulse: 64  Height: _0  (1.651 m)  Weight: 201 lb 9.6 oz (91.445 kg)    Not recorded    Body mass index is 33.55 kg/(m^2).  GENERAL EXAM: Patient is in no distress  CARDIOVASCULAR: Regular rate and rhythm, no murmurs, no carotid bruits  NEUROLOGIC: MENTAL STATUS: awake, alert, language fluent, comprehension intact, naming intact CRANIAL NERVE: no papilledema on fundoscopic exam, pupils equal and reactive to light, visual fields full to confrontation, extraocular muscles intact, no nystagmus, facial sensation and strength symmetric, uvula midline, shoulder shrug symmetric, tongue midline. MOTOR: normal bulk and tone, full strength in the BUE, BLE; POSTURAL TREMOR OF BUE.  SENSORY: normal and symmetric to light touch, temperature, vibration COORDINATION: finger-nose-finger, fine finger movements normal REFLEXES: deep tendon reflexes present and symmetric GAIT/STATION: narrow based gait; romberg is negative   DIAGNOSTIC DATA (LABS, IMAGING, TESTING) - I reviewed patient  records, labs, notes, testing and imaging myself where available.  Lab Results  Component Value Date   WBC 4.2 11/10/2006   HGB 15.1 11/10/2006   HCT 45.1 11/10/2006   MCV 88.1 11/10/2006   PLT 117* 11/10/2006      Component Value Date/Time   NA 144 04/27/2010 0341   K 4.3 04/27/2010 0341   CL 109 04/27/2010 0341   CO2 25 04/27/2010 0341   GLUCOSE 104* 04/27/2010 0341   BUN 19 04/27/2010 0341   CREATININE 1.8* 04/27/2010 0341   CALCIUM 9.3 04/27/2010 0341   GFRNONAA 41* 04/27/2010 0341   GFRAA  Value: 50        The eGFR has been calculated using the MDRD equation. This calculation has not been validated in all clinical situations. eGFR's persistently <60 mL/min signify possible Chronic Kidney Disease.* 04/27/2010 0341   No results found for this basename: CHOL,  HDL,  LDLCALC,  LDLDIRECT,  TRIG,  CHOLHDL   No results found for this basename: HGBA1C   No results found for this basename: VITAMINB12   No results found for this basename: TSH   No results found for this basename: PHENYTOIN, PHENOBARB, VALPROATE, CBMZ    09/15/08 EEG - right central-temporal epileptiform activity C4, T4  07/24/11 VPA 128 11/27/12 VPA 131    ASSESSMENT AND PLAN  48 y.o. 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 Doing well on Depakote ER + Topamax from seizure control standpoint. Had low platelets on last labs (98-->75). Needs follow up of platelet levels   PLAN: - continue depakote 1036m BID + TPX 1028mBID (both BRAND MEDICALLY NECESSARY) - check labs at annual physical with PCP (CBC and CMP) - follow up platelet levels on next labs; could be related to depakote   Return in about 6 months (around 04/26/2014).    VIPenni BombardMD 10/20/98/12392:3:59M Certified in Neurology, Neurophysiology and Neuroimaging  [facility] [address] [phone]

## 2013-10-26 NOTE — Patient Instructions (Signed)
Continue current medications.  Check CBC and CMP with PCP and fax results to our office.

## 2013-12-13 ENCOUNTER — Other Ambulatory Visit: Payer: Self-pay | Admitting: Diagnostic Neuroimaging

## 2013-12-30 ENCOUNTER — Telehealth: Payer: Self-pay | Admitting: Diagnostic Neuroimaging

## 2013-12-30 NOTE — Telephone Encounter (Signed)
Patient's spouse calling on behalf of patient to ask what a good over the counter cold medication that the patient can take, since he was previously told by Dr. Sandria ManlyLove that he should not take anti-histamines, please return call and advise.

## 2013-12-30 NOTE — Telephone Encounter (Signed)
Spoke to spouse. Advised to contact pharmacist or PCP in reference to over the counter cold medications. She agreed and verbalized understanding.

## 2014-04-26 ENCOUNTER — Ambulatory Visit: Payer: Self-pay | Admitting: Diagnostic Neuroimaging

## 2014-04-27 ENCOUNTER — Encounter: Payer: Self-pay | Admitting: Diagnostic Neuroimaging

## 2014-06-29 ENCOUNTER — Other Ambulatory Visit: Payer: Self-pay | Admitting: *Deleted

## 2014-06-29 ENCOUNTER — Telehealth: Payer: Self-pay | Admitting: Diagnostic Neuroimaging

## 2014-06-29 DIAGNOSIS — G40909 Epilepsy, unspecified, not intractable, without status epilepticus: Secondary | ICD-10-CM

## 2014-06-29 NOTE — Telephone Encounter (Signed)
Pt's wife, Aggie Cosierheresa, wants to know if he needs labs prior to office visit of 5/31. Please call and advise. Rosey Batheresa can be reached at [phone]. [place]

## 2014-06-29 NOTE — Telephone Encounter (Signed)
Spoke to the wife on the phone who stated that he was not due to see the PCP for his annual yet and wondered if Dr. Marjory LiesPenumalli would order the labs for him to have drawn before his appt on 07/12/14.  I spoke with Dr. Marjory LiesPenumalli who stated that he was ok with this. Orders were placed.  Called wife back and informed her that the labs for the pt were placed and to have him come by and get his blood work done before the appt. I informed her of the hours for the lab and she told me that she would tell her husband. She thanked me

## 2014-07-07 ENCOUNTER — Other Ambulatory Visit (INDEPENDENT_AMBULATORY_CARE_PROVIDER_SITE_OTHER): Payer: Self-pay

## 2014-07-07 DIAGNOSIS — Z0289 Encounter for other administrative examinations: Secondary | ICD-10-CM

## 2014-07-07 DIAGNOSIS — G40909 Epilepsy, unspecified, not intractable, without status epilepticus: Secondary | ICD-10-CM

## 2014-07-08 LAB — CBC WITH DIFFERENTIAL/PLATELET
BASOS ABS: 0 10*3/uL (ref 0.0–0.2)
Basos: 0 %
EOS (ABSOLUTE): 0.1 10*3/uL (ref 0.0–0.4)
EOS: 1 %
HEMOGLOBIN: 15 g/dL (ref 12.6–17.7)
Hematocrit: 44.9 % (ref 37.5–51.0)
IMMATURE GRANULOCYTES: 0 %
Immature Grans (Abs): 0 10*3/uL (ref 0.0–0.1)
LYMPHS: 58 %
Lymphocytes Absolute: 3.3 10*3/uL — ABNORMAL HIGH (ref 0.7–3.1)
MCH: 30.5 pg (ref 26.6–33.0)
MCHC: 33.4 g/dL (ref 31.5–35.7)
MCV: 91 fL (ref 79–97)
MONOCYTES: 7 %
MONOS ABS: 0.4 10*3/uL (ref 0.1–0.9)
NEUTROS PCT: 34 %
Neutrophils Absolute: 2 10*3/uL (ref 1.4–7.0)
PLATELETS: 80 10*3/uL — AB (ref 150–379)
RBC: 4.91 x10E6/uL (ref 4.14–5.80)
RDW: 14.6 % (ref 12.3–15.4)
WBC: 5.8 10*3/uL (ref 3.4–10.8)

## 2014-07-08 LAB — COMPREHENSIVE METABOLIC PANEL
ALK PHOS: 55 IU/L (ref 39–117)
ALT: 14 IU/L (ref 0–44)
AST: 22 IU/L (ref 0–40)
Albumin/Globulin Ratio: 1.4 (ref 1.1–2.5)
Albumin: 3.9 g/dL (ref 3.5–5.5)
BUN/Creatinine Ratio: 10 (ref 9–20)
BUN: 17 mg/dL (ref 6–24)
Bilirubin Total: 0.3 mg/dL (ref 0.0–1.2)
CHLORIDE: 106 mmol/L (ref 97–108)
CO2: 24 mmol/L (ref 18–29)
CREATININE: 1.7 mg/dL — AB (ref 0.76–1.27)
Calcium: 9.1 mg/dL (ref 8.7–10.2)
GFR calc non Af Amer: 46 mL/min/{1.73_m2} — ABNORMAL LOW (ref >59)
GFR, EST AFRICAN AMERICAN: 54 mL/min/{1.73_m2} — AB (ref >59)
GLOBULIN, TOTAL: 2.7 g/dL (ref 1.5–4.5)
Glucose: 86 mg/dL (ref 65–99)
POTASSIUM: 4.6 mmol/L (ref 3.5–5.2)
Sodium: 143 mmol/L (ref 134–144)
TOTAL PROTEIN: 6.6 g/dL (ref 6.0–8.5)

## 2014-07-12 ENCOUNTER — Ambulatory Visit (INDEPENDENT_AMBULATORY_CARE_PROVIDER_SITE_OTHER): Payer: PRIVATE HEALTH INSURANCE | Admitting: Diagnostic Neuroimaging

## 2014-07-12 ENCOUNTER — Encounter: Payer: Self-pay | Admitting: Diagnostic Neuroimaging

## 2014-07-12 VITALS — BP 120/81 | HR 78 | Ht 65.0 in | Wt 199.8 lb

## 2014-07-12 DIAGNOSIS — G40909 Epilepsy, unspecified, not intractable, without status epilepticus: Secondary | ICD-10-CM | POA: Diagnosis not present

## 2014-07-12 DIAGNOSIS — R251 Tremor, unspecified: Secondary | ICD-10-CM

## 2014-07-12 MED ORDER — DEPAKOTE ER 500 MG PO TB24: 1500.0000 mg | ORAL_TABLET | Freq: Every day | ORAL | Status: DC

## 2014-07-12 MED ORDER — TOPAMAX 100 MG PO TABS: 100.0000 mg | ORAL_TABLET | Freq: Two times a day (BID) | ORAL | Status: DC

## 2014-07-12 NOTE — Patient Instructions (Addendum)
On 07/13/14: take depakote ER 500mg  in AM and 1500mg  in PM.  On 07/14/14: take depakote ER 1500mg  in PM  Continue topiramate 100mg  twice a day.  Limit driving for next 1-3 months.

## 2014-07-12 NOTE — Progress Notes (Signed)
[facility]  PATIENT: Charles Powers DOB: Nov 21, 1965  REFERRING CLINICIAN:  HISTORY FROM: patient and wife REASON FOR VISIT: follow up   HISTORICAL  CHIEF COMPLAINT:  Chief Complaint  Patient presents with  . Follow-up    seizure disorder     HISTORY OF PRESENT ILLNESS:   UPDATE 07/12/14: Since last visit, doing about the same. Plt levels still low (80). No seizures. No tremors.   UPDATE 10/26/13: Since last visit, tremors have resolved. Now on lower dose of depakote ER. Continues on TPX. No seizures. Doing well.   UPDATE 03/11/13: Since last visit, has been having more tremors, with handwriting and eating. This fluctuates. No clear pattern. 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   UPDATE 07/12/14: Since last visit patient is doing well. No further seizures. Last seizure was in July 2010. Patient is on Depakote ER 500 mg twice a day plus Depakote ER  at bedtime. He is also on Topamax 100 mg twice a day. Patient continues with fatigue, daytime sleepiness, bilateral upper extremity tremor.  PRIOR HPI (03/31/12, Dr. Sandria Manly): 49 year old right-handed African American married male from [place], [place] with a history of early morning nocturnal generalized major motor seizures beginning in April 1994. At that time his neurologic examination, sleep deprived EEGs, and  MRI study of brain were normal. After having 2 seizures and normal EEGs he was placed on Dilantin medication but developed elevated liver function tests and in May of 1996 was changed to Depakote. His last CT 10/31/1997 was normal. 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 His weight ballooned on Depakote, 45 pounds to 211 pounds, then back down to 189 and now 211.5 pounds. He had episodes suggestive of myoclonic jerks. In 2002 he was begun on Topamax in addition to the Depakote. He has had generalized major motor seizures, 2 in  1994, one 11/04/1996, one 02/10/2000, one 03/18/2000,  one 2008, and one 09/07/2008. On Topamax he developed elevated creatinine and was seen by Dr. Geanie Berlin nephrologist. His last MRI study of the brain 04/04/2000  was normal with and without contrast. His last DEXA scan  09/12/2005  was normal. Blood studies 09/21/10=  WBC 7600, Hemoglobin 15.5, PLTC 91 K, sodium 141, potassium 4.7, chloride 111, CO2 28, BUN 16, Creatinine 1.6. Glucose 78, Valproic acid level 114.2, TSH 6.83. Repeat TSH 11/02/10, 3.08. CT scan of the brain without contrast was normal 09/07/2008. Medication schedule is Depakote ER brand name 500 mg 2 twice daily and  Depakote ER 250 mg one in the evening,Topamax brand name 100  milligrams one twice daily, and Synthroid 25 mcg once daily. Dr.Coladonato  does not feel his elevated creatinine is due to Topamax. patient denies any new symptoms. He denies dj vu, memory loss, hallucinations, delusions, depression, anxiety, or apathy . He does not exercise on a regular basis. Last blood levels 07/24/2011 were topiramate 7.6 and valproic acid 128. 09/19/2011  CBC and CMP were normal except platelet count 102K. He snores at night and he underwent polysomnogram 9/10/200  which was normal.   REVIEW OF SYSTEMS: Full 14 system review of systems performed and notable only for h/o seizure.   ALLERGIES: No Known Allergies  HOME MEDICATIONS: Outpatient Prescriptions Prior to Visit  Medication Sig Dispense Refill  . levothyroxine (SYNTHROID, LEVOTHROID) 75 MCG tablet Take 75 mcg by mouth daily before breakfast.    . DEPAKOTE ER 500 MG 24 hr  tablet TAKE 2 TABLETS BY MOUTH 2 TIMES A DAY. 120 tablet 11  . TOPAMAX 100 MG tablet TAKE 1 TABLET BY MOUTH 2 TIMES A DAY. 60 tablet 11  . Calcium Carbonate-Vitamin D (CALCIUM-VITAMIN D) 500-200 MG-UNIT per tablet Take 1 tablet by mouth 2 (two) times daily with a meal.     No facility-administered medications prior to visit.    PAST MEDICAL HISTORY: Past Medical  History  Diagnosis Date  . Seizures   . Essential tremor   . Hypothyroidism   . Elevated creatine kinase   . Temporary low platelet count   . Liver dysfunction     on Dilantin    PAST SURGICAL HISTORY: Past Surgical History  Procedure Laterality Date  . Cataract extraction      FAMILY HISTORY: Family History  Problem Relation Age of Onset  . Diabetes Mother   . Hypertension Mother   . Pancreatic cancer Father   . Epilepsy    . Cancer      SOCIAL HISTORY:  History   Social History  . Marital Status: Married    Spouse Name: Rosey Batheresa  . Number of Children: 0  . Years of Education: BA   Occupational History  .  [employer]   Social History Main Topics  . Smoking status: Never Smoker   . Smokeless tobacco: Never Used  . Alcohol Use: No  . Drug Use: No  . Sexual Activity: Not on file   Other Topics Concern  . Not on file   Social History Narrative   Patient lives at home with his spouse. Caffeine Use: occasionally     PHYSICAL EXAM  Filed Vitals:   07/12/14 1421  BP: 120/81  Pulse: 78  Height: 5\' 5"  (1.651 m)  Weight: 199 lb 12.8 oz (90.629 kg)    Not recorded      Body mass index is 33.25 kg/(m^2).  GENERAL EXAM: Patient is in no distress  CARDIOVASCULAR: Regular rate and rhythm, no murmurs, no carotid bruits  NEUROLOGIC: MENTAL STATUS: awake, alert, language fluent, comprehension intact, naming intact CRANIAL NERVE: pupils equal and reactive to light, visual fields full to confrontation, extraocular muscles intact, no nystagmus, facial sensation and strength symmetric, uvula midline, shoulder shrug symmetric, tongue midline. MOTOR: normal bulk and tone, full strength in the BUE, BLE; SUBTLE POSTURAL TREMOR OF BUE.  SENSORY: normal and symmetric to light touch, temperature, vibration COORDINATION: finger-nose-finger, fine finger movements normal REFLEXES: deep tendon reflexes present and symmetric GAIT/STATION: narrow based gait;  romberg is negative    DIAGNOSTIC DATA (LABS, IMAGING, TESTING) - I reviewed patient records, labs, notes, testing and imaging myself where available.   Lab Results  Component Value Date   WBC 5.8 07/07/2014   HGB 15.1 11/10/2006   HCT 44.9 07/07/2014   MCV 88.1 11/10/2006   PLT 117* 11/10/2006       Component Value Date/Time   NA 143 07/07/2014 1627   NA 144 04/27/2010 0341   K 4.6 07/07/2014 1627   CL 106 07/07/2014 1627   CO2 24 07/07/2014 1627   GLUCOSE 86 07/07/2014 1627   GLUCOSE 104* 04/27/2010 0341   BUN 17 07/07/2014 1627   BUN 19 04/27/2010 0341   CREATININE 1.70* 07/07/2014 1627   CALCIUM 9.1 07/07/2014 1627   PROT 6.6 07/07/2014 1627   AST 22 07/07/2014 1627   ALT 14 07/07/2014 1627   ALKPHOS 55 07/07/2014 1627   BILITOT 0.3 07/07/2014 1627   GFRNONAA 46* 07/07/2014 1627  GFRAA 54* 07/07/2014 1627   No results found for: CHOL No results found for: HGBA1C No results found for: VITAMINB12 No results found for: TSH No results found for: PHENYTOIN  09/15/08 EEG - right central-temporal epileptiform activity C4, T4  07/24/11 VPA 128  11/27/12 VPA 131   07/07/14 platelets - 80 (L)   ASSESSMENT AND PLAN  49 y.o. 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 Doing well on Depakote ER + Topamax from seizure control standpoint. Continues with low platelets.  PLAN: - reduce depakote ER to  qhs (BRAND MEDICALLY NECESSARY) - (will slightly reduce due to persistent low platelets) - continue TPX  BID (BRAND MEDICALLY NECESSARY)  - follow up platelet levels on next labs; could be related to depakote - caution with driving for next 1-3 months  Meds ordered this encounter  Medications  . DEPAKOTE ER 500 MG 24 hr tablet    Sig: Take 3 tablets (1,500 mg total) by mouth daily.    Dispense:  270 tablet    Refill:  4  . TOPAMAX 100 MG tablet    Sig: Take 1 tablet (100 mg  total) by mouth 2 (two) times daily.    Dispense:  180 tablet    Refill:  4   Return in about 3 months (around 10/12/2014).  I spent 25 minutes of face to face time with patient. Greater than 50% of time was spent in counseling and coordination of care with patient.     Suanne Marker, MD 07/12/2014, 2:58 PM Certified in Neurology, Neurophysiology and Neuroimaging  [facility] [address] [place], [place] [address] [phone]

## 2014-10-12 ENCOUNTER — Encounter: Payer: Self-pay | Admitting: Diagnostic Neuroimaging

## 2014-10-12 ENCOUNTER — Ambulatory Visit (INDEPENDENT_AMBULATORY_CARE_PROVIDER_SITE_OTHER): Payer: PRIVATE HEALTH INSURANCE | Admitting: Diagnostic Neuroimaging

## 2014-10-12 VITALS — BP 120/78 | HR 53 | Ht 65.0 in | Wt 203.0 lb

## 2014-10-12 DIAGNOSIS — G40909 Epilepsy, unspecified, not intractable, without status epilepticus: Secondary | ICD-10-CM

## 2014-10-12 NOTE — Progress Notes (Signed)
[facility]  PATIENT: Charles Powers DOB: [birth year]  REFERRING CLINICIAN:  HISTORY FROM: patient and wife REASON FOR VISIT: follow up   HISTORICAL  CHIEF COMPLAINT:  Chief Complaint  Patient presents with  . Seizures    rm 6  . Follow-up    3 month    HISTORY OF PRESENT ILLNESS:   UPDATE 10/12/14: Since last visit, doing well. No sz. Tolerating lower depakote dose.   UPDATE 07/12/14: Since last visit, doing about the same. Plt levels still low (80). No seizures. No tremors.   UPDATE 10/26/13: Since last visit, tremors have resolved. Now on lower dose of depakote ER. Continues on TPX. No seizures. Doing well.   UPDATE 03/11/13: Since last visit, has been having more tremors, with handwriting and eating. This fluctuates. No clear pattern. 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   UPDATE 12/15/12: Since last visit patient is doing well. No further seizures. Last seizure was in July 2010. Patient is on Depakote ER 500 mg twice a day plus Depakote ER  at bedtime. He is also on Topamax 100 mg twice a day. Patient continues with fatigue, daytime sleepiness, bilateral upper extremity tremor.  PRIOR HPI (03/31/12, Dr. Sandria Manly): 49 year old right-handed African American married male from [place], [place] with a history of early morning nocturnal generalized major motor seizures beginning in April 1994. At that time his neurologic examination, sleep deprived EEGs, and  MRI study of brain were normal. After having 2 seizures and normal EEGs he was placed on Dilantin medication but developed elevated liver function tests and in May of 1996 was changed to Depakote. His last CT 10/31/1997 was normal. 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 His weight ballooned on Depakote, 45 pounds to 211 pounds, then back down to 189 and now 211.5 pounds. He had episodes suggestive of myoclonic jerks. In 2002  he was begun on Topamax in addition to the Depakote. He has had generalized major motor seizures, 2 in 1994, one 11/04/1996, one 02/10/2000, one 03/18/2000,  one 2008, and one 09/07/2008. On Topamax he developed elevated creatinine and was seen by Dr. Geanie Berlin nephrologist. His last MRI study of the brain 04/04/2000  was normal with and without contrast. His last DEXA scan  09/12/2005  was normal. Blood studies 09/21/10=  WBC 7600, Hemoglobin 15.5, PLTC 91 K, sodium 141, potassium 4.7, chloride 111, CO2 28, BUN 16, Creatinine 1.6. Glucose 78, Valproic acid level 114.2, TSH 6.83. Repeat TSH 11/02/10, 3.08. CT scan of the brain without contrast was normal 09/07/2008. Medication schedule is Depakote ER brand name 500 mg 2 twice daily and  Depakote ER 250 mg one in the evening,Topamax brand name 100  milligrams one twice daily, and Synthroid 25 mcg once daily. Dr.Coladonato  does not feel his elevated creatinine is due to Topamax. patient denies any new symptoms. He denies dj vu, memory loss, hallucinations, delusions, depression, anxiety, or apathy . He does not exercise on a regular basis. Last blood levels 07/24/2011 were topiramate 7.6 and valproic acid 128. 09/19/2011  CBC and CMP were normal except platelet count 102K. He snores at night and he underwent polysomnogram 9/10/200  which was normal.   REVIEW OF SYSTEMS: Full 14 system review of systems performed and notable only for h/o seizure.   ALLERGIES: Allergies  Allergen Reactions  . Antihistamines, Chlorpheniramine-Type     Patient states wife does not like him to  take    HOME MEDICATIONS: Outpatient Prescriptions Prior to Visit  Medication Sig Dispense Refill  . DEPAKOTE ER 500 MG 24 hr tablet Take 3 tablets (1,500 mg total) by mouth daily. 270 tablet 4  . levothyroxine (SYNTHROID, LEVOTHROID) 75 MCG tablet Take 75 mcg by mouth daily before breakfast.    . TOPAMAX 100 MG tablet Take 1 tablet (100 mg total) by mouth 2 (two) times daily. 180 tablet 4    . Calcium Carbonate-Vitamin D (CALCIUM-VITAMIN D) 500-200 MG-UNIT per tablet Take 1 tablet by mouth 2 (two) times daily with a meal.     No facility-administered medications prior to visit.    PAST MEDICAL HISTORY: Past Medical History  Diagnosis Date  . Essential tremor   . Hypothyroidism   . Elevated creatine kinase   . Temporary low platelet count   . Liver dysfunction     on Dilantin  . Seizures     PAST SURGICAL HISTORY: Past Surgical History  Procedure Laterality Date  . Cataract extraction      FAMILY HISTORY: Family History  Problem Relation Age of Onset  . Diabetes Mother   . Hypertension Mother   . Cancer Mother   . Pancreatic cancer Father   . Epilepsy    . Cancer      SOCIAL HISTORY:  Social History   Social History  . Marital Status: Married    Spouse Name: Rosey Bath  . Number of Children: 0  . Years of Education: BA   Occupational History  .  [employer]   Social History Main Topics  . Smoking status: Never Smoker   . Smokeless tobacco: Never Used  . Alcohol Use: No  . Drug Use: No  . Sexual Activity: Not on file   Other Topics Concern  . Not on file   Social History Narrative   Patient lives at home with his spouse.   Caffeine Use: occasionally     PHYSICAL EXAM  Filed Vitals:   10/12/14 1404  BP: 120/78  Pulse: 53  Height: 5\' 5"  (1.651 m)  Weight: 203 lb (92.08 kg)    Not recorded      Body mass index is 33.78 kg/(m^2).  GENERAL EXAM: Patient is in no distress  CARDIOVASCULAR: Regular rate and rhythm, no murmurs, no carotid bruits  NEUROLOGIC: MENTAL STATUS: awake, alert, language fluent, comprehension intact, naming intact CRANIAL NERVE: pupils equal and reactive to light, visual fields full to confrontation, extraocular muscles intact, no nystagmus, facial sensation and strength symmetric, uvula midline, shoulder shrug symmetric, tongue midline. MOTOR: normal bulk and tone, full strength in the BUE, BLE;  SUBTLE POSTURAL TREMOR OF BUE.  SENSORY: normal and symmetric to light touch, temperature, vibration COORDINATION: finger-nose-finger, fine finger movements normal REFLEXES: deep tendon reflexes present and symmetric GAIT/STATION: narrow based gait; romberg is negative    DIAGNOSTIC DATA (LABS, IMAGING, TESTING) - I reviewed patient records, labs, notes, testing and imaging myself where available.   Lab Results  Component Value Date   WBC 5.8 07/07/2014   HGB 15.1 11/10/2006   HCT 44.9 07/07/2014   MCV 88.1 11/10/2006   PLT 117* 11/10/2006       Component Value Date/Time   NA 143 07/07/2014 1627   NA 144 04/27/2010 0341   K 4.6 07/07/2014 1627   CL 106 07/07/2014 1627   CO2 24 07/07/2014 1627   GLUCOSE 86 07/07/2014 1627   GLUCOSE 104* 04/27/2010 0341   BUN 17 07/07/2014 1627   BUN  19 04/27/2010 0341   CREATININE 1.70* 07/07/2014 1627   CALCIUM 9.1 07/07/2014 1627   PROT 6.6 07/07/2014 1627   AST 22 07/07/2014 1627   ALT 14 07/07/2014 1627   ALKPHOS 55 07/07/2014 1627   BILITOT 0.3 07/07/2014 1627   GFRNONAA 46* 07/07/2014 1627   GFRAA 54* 07/07/2014 1627   No results found for: CHOL No results found for: HGBA1C No results found for: VITAMINB12 No results found for: TSH No results found for: PHENYTOIN  09/15/08 EEG - right central-temporal epileptiform activity C4, T4  07/24/11 VPA 128  11/27/12 VPA 131   07/07/14 platelets - 80 (L)   ASSESSMENT AND PLAN  49 y.o. 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 Doing well on Depakote ER + Topamax from seizure control standpoint. Need to monitor platelets, which could be low due to long term depakote use. Last seizure July 2010.   PLAN: - continue depakote ER 1500mg  qhs (BRAND MEDICALLY NECESSARY)  - continue TPX 100mg  BID (BRAND MEDICALLY NECESSARY)  - follow up platelet levels today; could be related to depakote  Orders Placed  This Encounter  Procedures  . CBC with Differential/Platelet   Return in about 3 months (around 01/11/2015).    Suanne Marker, MD 10/12/2014, 2:39 PM Certified in Neurology, Neurophysiology and Neuroimaging  [facility] [address], [address] [place], [place] [address] [phone]

## 2014-10-12 NOTE — Patient Instructions (Signed)
Check labs today.

## 2014-10-13 ENCOUNTER — Telehealth: Payer: Self-pay | Admitting: *Deleted

## 2014-10-13 LAB — CBC WITH DIFFERENTIAL/PLATELET
Basophils Absolute: 0 10*3/uL (ref 0.0–0.2)
Basos: 0 %
EOS (ABSOLUTE): 0 10*3/uL (ref 0.0–0.4)
Eos: 1 %
HEMATOCRIT: 44.6 % (ref 37.5–51.0)
Hemoglobin: 14.8 g/dL (ref 12.6–17.7)
Immature Grans (Abs): 0 10*3/uL (ref 0.0–0.1)
Immature Granulocytes: 0 %
LYMPHS ABS: 3 10*3/uL (ref 0.7–3.1)
LYMPHS: 54 %
MCH: 29.8 pg (ref 26.6–33.0)
MCHC: 33.2 g/dL (ref 31.5–35.7)
MCV: 90 fL (ref 79–97)
MONOCYTES: 8 %
MONOS ABS: 0.5 10*3/uL (ref 0.1–0.9)
NEUTROS ABS: 2.1 10*3/uL (ref 1.4–7.0)
Neutrophils: 37 %
PLATELETS: 112 10*3/uL — AB (ref 150–379)
RBC: 4.97 x10E6/uL (ref 4.14–5.80)
RDW: 14.3 % (ref 12.3–15.4)
WBC: 5.6 10*3/uL (ref 3.4–10.8)

## 2014-10-13 NOTE — Telephone Encounter (Signed)
Spoke with patient's wife and informed her per Dr Marjory Lies, platelet count improving; her husband should continue with current medications. Wife verbalized understanding, expressed appreciation for this call and Dr Marjory Lies.

## 2014-10-13 NOTE — Telephone Encounter (Signed)
-----   Message from Suanne Marker, MD sent at 10/13/2014  9:51 AM EDT ----- Platelets are improving. Continue current medications. pls call patient. -VRP

## 2015-01-11 ENCOUNTER — Encounter: Payer: Self-pay | Admitting: Diagnostic Neuroimaging

## 2015-01-11 ENCOUNTER — Other Ambulatory Visit: Payer: Self-pay

## 2015-01-11 ENCOUNTER — Ambulatory Visit (INDEPENDENT_AMBULATORY_CARE_PROVIDER_SITE_OTHER): Payer: PRIVATE HEALTH INSURANCE | Admitting: Diagnostic Neuroimaging

## 2015-01-11 VITALS — BP 110/73 | HR 87 | Ht 65.0 in | Wt 203.0 lb

## 2015-01-11 DIAGNOSIS — G40909 Epilepsy, unspecified, not intractable, without status epilepticus: Secondary | ICD-10-CM

## 2015-01-11 MED ORDER — TOPAMAX 100 MG PO TABS: 100.0000 mg | ORAL_TABLET | Freq: Two times a day (BID) | ORAL | Status: DC

## 2015-01-11 MED ORDER — DEPAKOTE ER 500 MG PO TB24: 1500.0000 mg | ORAL_TABLET | Freq: Every day | ORAL | Status: DC

## 2015-01-11 NOTE — Progress Notes (Signed)
[facility]  PATIENT: Charles Powers DOB: 01-May-1965  REFERRING CLINICIAN:  HISTORY FROM: patient and wife REASON FOR VISIT: follow up   HISTORICAL  CHIEF COMPLAINT:  Chief Complaint  Patient presents with  . Seizures    rm 7, wife- Rosey Bath  . Follow-up    3 months    HISTORY OF PRESENT ILLNESS:   UPDATE 01/11/15: Since last visit, doing well. No sz. Pls were improved in Aug 2016 lab check.  UPDATE 10/12/14: Since last visit, doing well. No sz. Tolerating lower depakote dose.   UPDATE 07/12/14: Since last visit, doing about the same. Plt levels still low (80). No seizures. No tremors.   UPDATE 10/26/13: Since last visit, tremors have resolved. Now on lower dose of depakote ER. Continues on TPX. No seizures. Doing well.   UPDATE 03/11/13: Since last visit, has been having more tremors, with handwriting and eating. This fluctuates. No clear pattern. 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   UPDATE 12/15/12: Since last visit patient is doing well. No further seizures. Last seizure was in July 2010. Patient is on Depakote ER 500 mg twice a day plus Depakote ER  at bedtime. He is also on Topamax 100 mg twice a day. Patient continues with fatigue, daytime sleepiness, bilateral upper extremity tremor.  PRIOR HPI (03/31/12, Dr. Sandria Manly): 49 year old right-handed African American married male from [place], [place] with a history of early morning nocturnal generalized major motor seizures beginning in April 1994. At that time his neurologic examination, sleep deprived EEGs, and  MRI study of brain were normal. After having 2 seizures and normal EEGs he was placed on Dilantin medication but developed elevated liver function tests and in May of 1996 was changed to Depakote. His last CT 10/31/1997 was normal. 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 His weight ballooned on Depakote, 45 pounds to  211 pounds, then back down to 189 and now 211.5 pounds. He had episodes suggestive of myoclonic jerks. In 2002 he was begun on Topamax in addition to the Depakote. He has had generalized major motor seizures, 2 in 1994, one 11/04/1996, one 02/10/2000, one 03/18/2000,  one 2008, and one 09/07/2008. On Topamax he developed elevated creatinine and was seen by Dr. Geanie Berlin nephrologist. His last MRI study of the brain 04/04/2000  was normal with and without contrast. His last DEXA scan  09/12/2005  was normal. Blood studies 09/21/10=  WBC 7600, Hemoglobin 15.5, PLTC 91 K, sodium 141, potassium 4.7, chloride 111, CO2 28, BUN 16, Creatinine 1.6. Glucose 78, Valproic acid level 114.2, TSH 6.83. Repeat TSH 11/02/10, 3.08. CT scan of the brain without contrast was normal 09/07/2008. Medication schedule is Depakote ER brand name 500 mg 2 twice daily and  Depakote ER 250 mg one in the evening,Topamax brand name 100  milligrams one twice daily, and Synthroid 25 mcg once daily. Dr.Coladonato  does not feel his elevated creatinine is due to Topamax. patient denies any new symptoms. He denies dj vu, memory loss, hallucinations, delusions, depression, anxiety, or apathy . He does not exercise on a regular basis. Last blood levels 07/24/2011 were topiramate 7.6 and valproic acid 128. 09/19/2011  CBC and CMP were normal except platelet count 102K. He snores at night and he underwent polysomnogram 9/10/200  which was normal.   REVIEW OF SYSTEMS: Full 14 system review of systems performed and notable only for h/o seizure.   ALLERGIES:  Allergies  Allergen Reactions  . Antihistamines, Chlorpheniramine-Type     Patient states wife does not like him to take    HOME MEDICATIONS: Outpatient Prescriptions Prior to Visit  Medication Sig Dispense Refill  . DEPAKOTE ER 500 MG 24 hr tablet Take 3 tablets (1,500 mg total) by mouth daily. 270 tablet 4  . levothyroxine (SYNTHROID, LEVOTHROID) 75 MCG tablet Take 75 mcg by mouth daily before  breakfast.    . TOPAMAX 100 MG tablet Take 1 tablet (100 mg total) by mouth 2 (two) times daily. 180 tablet 4   No facility-administered medications prior to visit.    PAST MEDICAL HISTORY: Past Medical History  Diagnosis Date  . Essential tremor   . Hypothyroidism   . Elevated creatine kinase   . Temporary low platelet count (HCC)   . Liver dysfunction     on Dilantin  . Seizures (HCC)     PAST SURGICAL HISTORY: Past Surgical History  Procedure Laterality Date  . Cataract extraction      FAMILY HISTORY: Family History  Problem Relation Age of Onset  . Diabetes Mother   . Hypertension Mother   . Cancer Mother   . Pancreatic cancer Father   . Epilepsy    . Cancer      SOCIAL HISTORY:  Social History   Social History  . Marital Status: Married    Spouse Name: Rosey Batheresa  . Number of Children: 0  . Years of Education: BA   Occupational History  .  [employer]   Social History Main Topics  . Smoking status: Never Smoker   . Smokeless tobacco: Never Used  . Alcohol Use: No  . Drug Use: No  . Sexual Activity: Not on file   Other Topics Concern  . Not on file   Social History Narrative   Patient lives at home with his spouse.   Caffeine Use: occasionally     PHYSICAL EXAM  Filed Vitals:   01/11/15 1529  BP: 110/73  Pulse: 87  Height: 5\' 5"  (1.651 m)  Weight: 203 lb (92.08 kg)    Not recorded      Body mass index is 33.78 kg/(m^2).  GENERAL EXAM: Patient is in no distress  CARDIOVASCULAR: Regular rate and rhythm, no murmurs, no carotid bruits  NEUROLOGIC: MENTAL STATUS: awake, alert, language fluent, comprehension intact, naming intact CRANIAL NERVE: pupils equal and reactive to light, visual fields full to confrontation, extraocular muscles intact, no nystagmus, facial sensation and strength symmetric, uvula midline, shoulder shrug symmetric, tongue midline. MOTOR: normal bulk and tone, full strength in the BUE, BLE; SUBTLE  POSTURAL TREMOR OF BUE.  SENSORY: normal and symmetric to light touch, temperature, vibration COORDINATION: finger-nose-finger, fine finger movements normal REFLEXES: deep tendon reflexes present and symmetric GAIT/STATION: narrow based gait; TANDEM STABLE; romberg is negative    DIAGNOSTIC DATA (LABS, IMAGING, TESTING) - I reviewed patient records, labs, notes, testing and imaging myself where available.  CBC Latest Ref Rng 10/12/2014 07/07/2014 11/10/2006  WBC 3.4 - 10.8 x10E3/uL 5.6 5.8 4.2  Hemoglobin - - - 15.1  Hematocrit 37.5 - 51.0 % 44.6 44.9 45.1  Platelets - - - 117(L)       Component Value Date/Time   NA 143 07/07/2014 1627   NA 144 04/27/2010 0341   K 4.6 07/07/2014 1627   CL 106 07/07/2014 1627   CO2 24 07/07/2014 1627   GLUCOSE 86 07/07/2014 1627   GLUCOSE 104* 04/27/2010 0341   BUN 17 07/07/2014 1627  BUN 19 04/27/2010 0341   CREATININE 1.70* 07/07/2014 1627   CALCIUM 9.1 07/07/2014 1627   PROT 6.6 07/07/2014 1627   ALBUMIN 3.9 07/07/2014 1627   AST 22 07/07/2014 1627   ALT 14 07/07/2014 1627   ALKPHOS 55 07/07/2014 1627   BILITOT 0.3 07/07/2014 1627   GFRNONAA 46* 07/07/2014 1627   GFRAA 54* 07/07/2014 1627   No results found for: CHOL No results found for: HGBA1C No results found for: VITAMINB12 No results found for: TSH No results found for: PHENYTOIN  09/15/08 EEG - right central-temporal epileptiform activity C4, T4  07/24/11 VPA 128  11/27/12 VPA 131   07/07/14 platelets - 80 (L)   ASSESSMENT AND PLAN  49 y.o. 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 Doing well on Depakote ER + Topamax from seizure control standpoint. Need to monitor platelets, which could be low due to long term depakote use. Last seizure July 2010.   PLAN: - continue depakote ER  qhs (BRAND MEDICALLY NECESSARY)  - continue TPX  BID (BRAND MEDICALLY NECESSARY)  - follow up  platelet levels with PCP; could be related to depakote  Return in about 6 months (around 07/11/2015).    Suanne Marker, MD 01/11/2015, 3:38 PM Certified in Neurology, Neurophysiology and Neuroimaging  [facility] [address] [place], [place] [address] [phone]

## 2015-01-11 NOTE — Addendum Note (Signed)
Addended byJoycelyn Schmid: PENUMALLI, VIKRAM on: 01/11/2015 04:17 PM   Modules accepted: Orders

## 2015-01-11 NOTE — Patient Instructions (Signed)

## 2015-01-12 ENCOUNTER — Telehealth: Payer: Self-pay | Admitting: *Deleted

## 2015-01-12 LAB — CBC WITH DIFFERENTIAL/PLATELET
BASOS ABS: 0 10*3/uL (ref 0.0–0.2)
Basos: 0 %
EOS (ABSOLUTE): 0 10*3/uL (ref 0.0–0.4)
Eos: 1 %
HEMATOCRIT: 44.4 % (ref 37.5–51.0)
HEMOGLOBIN: 15.1 g/dL (ref 12.6–17.7)
Immature Grans (Abs): 0 10*3/uL (ref 0.0–0.1)
Immature Granulocytes: 0 %
Lymphocytes Absolute: 2.8 10*3/uL (ref 0.7–3.1)
Lymphs: 49 %
MCH: 29.8 pg (ref 26.6–33.0)
MCHC: 34 g/dL (ref 31.5–35.7)
MCV: 88 fL (ref 79–97)
MONOCYTES: 5 %
MONOS ABS: 0.3 10*3/uL (ref 0.1–0.9)
NEUTROS ABS: 2.5 10*3/uL (ref 1.4–7.0)
Neutrophils: 45 %
Platelets: 118 10*3/uL — ABNORMAL LOW (ref 150–379)
RBC: 5.06 x10E6/uL (ref 4.14–5.80)
RDW: 14.6 % (ref 12.3–15.4)
WBC: 5.7 10*3/uL (ref 3.4–10.8)

## 2015-01-12 NOTE — Telephone Encounter (Signed)
LVM requesting call back. Left this caller's name, number. 

## 2015-01-12 NOTE — Telephone Encounter (Signed)
Received call from patient's wife, Rosey Batheresa. Informed her that per Dr Marjory LiesPenumalli, patient's labs are normal except platelet count which is low. Informed her that platelet count is gradually improving since May 2016. She verbalized understanding, appreciation for call.

## 2015-07-12 ENCOUNTER — Ambulatory Visit: Payer: PRIVATE HEALTH INSURANCE | Admitting: Diagnostic Neuroimaging

## 2015-07-13 ENCOUNTER — Encounter: Payer: Self-pay | Admitting: Diagnostic Neuroimaging

## 2015-08-07 ENCOUNTER — Telehealth: Payer: Self-pay | Admitting: Diagnostic Neuroimaging

## 2015-08-07 DIAGNOSIS — Z0289 Encounter for other administrative examinations: Secondary | ICD-10-CM

## 2015-08-07 NOTE — Telephone Encounter (Signed)
Patient requesting refill of TOPAMAX 100 MG tablet Pharmacy: [facility] [place] - [address]

## 2015-08-07 NOTE — Telephone Encounter (Signed)
LVM informing patient Dr Marjory LiesPenumalli gave him 1 year of refills when he was last seen 01/11/16. Advised he should not have run out of this medication. Left name, number for questions.

## 2015-09-13 ENCOUNTER — Ambulatory Visit (INDEPENDENT_AMBULATORY_CARE_PROVIDER_SITE_OTHER): Payer: PRIVATE HEALTH INSURANCE | Admitting: Diagnostic Neuroimaging

## 2015-09-13 ENCOUNTER — Encounter: Payer: Self-pay | Admitting: Diagnostic Neuroimaging

## 2015-09-13 VITALS — BP 116/78 | HR 85 | Wt 212.6 lb

## 2015-09-13 DIAGNOSIS — G40909 Epilepsy, unspecified, not intractable, without status epilepticus: Secondary | ICD-10-CM | POA: Diagnosis not present

## 2015-09-13 DIAGNOSIS — R251 Tremor, unspecified: Secondary | ICD-10-CM

## 2015-09-13 MED ORDER — TOPAMAX 100 MG PO TABS: 100.0000 mg | ORAL_TABLET | Freq: Two times a day (BID) | ORAL | 4 refills | Status: DC

## 2015-09-13 MED ORDER — DEPAKOTE ER 500 MG PO TB24: 1500.0000 mg | ORAL_TABLET | Freq: Every day | ORAL | 4 refills | Status: DC

## 2015-09-13 NOTE — Patient Instructions (Addendum)
-   continue current medications  - check labs today 

## 2015-09-13 NOTE — Progress Notes (Signed)
[facility]  PATIENT: Charles Powers DOB: 11/02/1965  REFERRING CLINICIAN:  HISTORY FROM: patient REASON FOR VISIT: follow up   HISTORICAL  CHIEF COMPLAINT:  Chief Complaint  Patient presents with  . Seizures    rm 6, "no seizure activity in past year"  . Follow-up    6 month    HISTORY OF PRESENT ILLNESS:   UPDATE 09/13/15: Since last visit doing well. No sz. No blood tests since last visit. Tremor stable.   UPDATE 01/11/15: Since last visit, doing well. No sz. Plts were improved in Aug 2016 lab check.  UPDATE 10/12/14: Since last visit, doing well. No sz. Tolerating lower depakote dose.   UPDATE 07/12/14: Since last visit, doing about the same. Plt levels still low (80). No seizures. No tremors.   UPDATE 10/26/13: Since last visit, tremors have resolved. Now on lower dose of depakote ER. Continues on TPX. No seizures. Doing well.   UPDATE 03/11/13: Since last visit, has been having more tremors, with handwriting and eating. This fluctuates. No clear pattern. 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   UPDATE 12/15/12: Since last visit patient is doing well. No further seizures. Last seizure was in July 2010. Patient is on Depakote ER 500 mg twice a day plus Depakote ER 250mg  at bedtime. He is also on Topamax 100 mg twice a day. Patient continues with fatigue, daytime sleepiness, bilateral upper extremity tremor.  PRIOR HPI (03/31/12, Dr. Sandria Manly): 50 year old right-handed African American married male from [place], [place] with a history of early morning nocturnal generalized major motor seizures beginning in April 1994. At that time his neurologic examination, sleep deprived EEGs, and  MRI study of brain were normal. After having 2 seizures and normal EEGs he was placed on Dilantin medication but developed elevated liver function tests and in May of 1996 was changed to Depakote. His last CT  10/31/1997 was normal. 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 His weight ballooned on Depakote, 45 pounds to 211 pounds, then back down to 189 and now 211.5 pounds. He had episodes suggestive of myoclonic jerks. In 2002 he was begun on Topamax in addition to the Depakote. He has had generalized major motor seizures, 2 in 1994, one 11/04/1996, one 02/10/2000, one 03/18/2000,  one 2008, and one 09/07/2008. On Topamax he developed elevated creatinine and was seen by Dr. Geanie Berlin nephrologist. His last MRI study of the brain 04/04/2000  was normal with and without contrast. His last DEXA scan  09/12/2005  was normal. Blood studies 09/21/10=  WBC 7600, Hemoglobin 15.5, PLTC 91 K, sodium 141, potassium 4.7, chloride 111, CO2 28, BUN 16, Creatinine 1.6. Glucose 78, Valproic acid level 114.2, TSH 6.83. Repeat TSH 11/02/10, 3.08. CT scan of the brain without contrast was normal 09/07/2008. Medication schedule is Depakote ER brand name 500 mg 2 twice daily and  Depakote ER 250 mg one in the evening,Topamax brand name 100  milligrams one twice daily, and Synthroid 25 mcg once daily. Dr.Coladonato  does not feel his elevated creatinine is due to Topamax. patient denies any new symptoms. He denies dj vu, memory loss, hallucinations, delusions, depression, anxiety, or apathy . He does not exercise on a regular basis. Last blood levels 07/24/2011 were topiramate 7.6 and valproic acid 128. 09/19/2011  CBC and CMP were normal except platelet count 102K. He snores at night and he underwent polysomnogram 9/10/200  which was normal.  REVIEW OF SYSTEMS: Full 14 system review of systems performed and notable only for h/o seizure.   ALLERGIES: Allergies  Allergen Reactions  . Antihistamines, Chlorpheniramine-Type     Patient states wife does not like him to take    HOME MEDICATIONS: Outpatient Medications Prior to Visit  Medication Sig Dispense Refill  . levothyroxine (SYNTHROID, LEVOTHROID) 75 MCG tablet Take 75 mcg  by mouth daily before breakfast.    . DEPAKOTE ER 500 MG 24 hr tablet Take 3 tablets (1,500 mg total) by mouth daily. 270 tablet 4  . TOPAMAX 100 MG tablet Take 1 tablet (100 mg total) by mouth 2 (two) times daily. 180 tablet 4   No facility-administered medications prior to visit.     PAST MEDICAL HISTORY: Past Medical History:  Diagnosis Date  . Elevated creatine kinase   . Essential tremor   . Hypothyroidism   . Liver dysfunction    on Dilantin  . Seizures (HCC)   . Temporary low platelet count (HCC)     PAST SURGICAL HISTORY: Past Surgical History:  Procedure Laterality Date  . CATARACT EXTRACTION      FAMILY HISTORY: Family History  Problem Relation Age of Onset  . Diabetes Mother   . Hypertension Mother   . Cancer Mother   . Pancreatic cancer Father   . Epilepsy    . Cancer    . Cancer Sister     breast    SOCIAL HISTORY:  Social History   Social History  . Marital status: Married    Spouse name: Rosey Bath  . Number of children: 0  . Years of education: BA   Occupational History  .  [employer]   Social History Main Topics  . Smoking status: Never Smoker  . Smokeless tobacco: Never Used  . Alcohol use No  . Drug use: No  . Sexual activity: Not on file   Other Topics Concern  . Not on file   Social History Narrative   Patient lives at home with his spouse.   Caffeine Use: occasionally     PHYSICAL EXAM  Vitals:   09/13/15 1035  BP: 116/78  Pulse: 85  Weight: 212 lb 9.6 oz (96.4 kg)    Not recorded     Wt Readings from Last 3 Encounters:  09/13/15 212 lb 9.6 oz (96.4 kg)  01/11/15 203 lb (92.1 kg)  10/12/14 203 lb (92.1 kg)   Body mass index is 35.38 kg/m.  GENERAL EXAM: Patient is in no distress  CARDIOVASCULAR: Regular rate and rhythm, no murmurs, no carotid bruits  NEUROLOGIC: MENTAL STATUS: awake, alert, language fluent, comprehension intact, naming intact CRANIAL NERVE: pupils equal and reactive to light,  visual fields full to confrontation, extraocular muscles intact, no nystagmus, facial sensation and strength symmetric, uvula midline, shoulder shrug symmetric, tongue midline. MOTOR: normal bulk and tone, full strength in the BUE, BLE; SUBTLE POSTURAL TREMOR OF BUE.  SENSORY: normal and symmetric to light touch, temperature, vibration COORDINATION: finger-nose-finger, fine finger movements normal REFLEXES: deep tendon reflexes present and symmetric GAIT/STATION: narrow based gait; TANDEM STABLE; romberg is negative    DIAGNOSTIC DATA (LABS, IMAGING, TESTING) - I reviewed patient records, labs, notes, testing and imaging myself where available.  CBC Latest Ref Rng & Units 01/11/2015 10/12/2014 07/07/2014  WBC 3.4 - 10.8 x10E3/uL 5.7 5.6 5.8  Hemoglobin - - - -  Hematocrit 37.5 - 51.0 % 44.4 44.6 44.9  Platelets 150 - 379 x10E3/uL 118(L) 112(L) 80(LL)  Component Value Date/Time   NA 143 07/07/2014 1627   K 4.6 07/07/2014 1627   CL 106 07/07/2014 1627   CO2 24 07/07/2014 1627   GLUCOSE 86 07/07/2014 1627   GLUCOSE 104 (H) 04/27/2010 0341   BUN 17 07/07/2014 1627   CREATININE 1.70 (H) 07/07/2014 1627   CALCIUM 9.1 07/07/2014 1627   PROT 6.6 07/07/2014 1627   ALBUMIN 3.9 07/07/2014 1627   AST 22 07/07/2014 1627   ALT 14 07/07/2014 1627   ALKPHOS 55 07/07/2014 1627   BILITOT 0.3 07/07/2014 1627   GFRNONAA 46 (L) 07/07/2014 1627   GFRAA 54 (L) 07/07/2014 1627   No results found for: CHOL No results found for: HGBA1C No results found for: VITAMINB12 No results found for: TSH No results found for: PHENYTOIN  09/15/08 EEG - right central-temporal epileptiform activity C4, T4  07/24/11 VPA 128  11/27/12 VPA 131     ASSESSMENT AND PLAN  50 y.o. 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 Doing well on Depakote ER + Topamax from seizure control standpoint. Need to monitor platelets, which could  be low due to long term depakote use. Mild postural tremor is stable. Last seizure July 2010.   Dx:  Seizure disorder (HCC)  Tremor     PLAN: - continue depakote ER  qhs (BRAND MEDICALLY NECESSARY)  - continue TPX  BID (BRAND MEDICALLY NECESSARY)  - check labs today (follow up platelet levels; in future can follow up with PCP; could be related to depakote)  Meds ordered this encounter  Medications  . DEPAKOTE ER 500 MG 24 hr tablet    Sig: Take 3 tablets (1,500 mg total) by mouth daily.    Dispense:  270 tablet    Refill:  4    BRAND MEDICALLY NECESSARY  . TOPAMAX 100 MG tablet    Sig: Take 1 tablet (100 mg total) by mouth 2 (two) times daily.    Dispense:  180 tablet    Refill:  4    BRAND MEDICALLY NECESSARY   Orders Placed This Encounter  Procedures  . Comprehensive metabolic panel  . Valproic acid level  . CBC with Differential/Platelet   Return in about 1 year (around 09/12/2016).    Suanne Marker, MD 09/13/2015, 11:12 AM Certified in Neurology, Neurophysiology and Neuroimaging  [facility] [address], [address] [place], [place] [address] [phone]

## 2015-09-14 ENCOUNTER — Telehealth: Payer: Self-pay | Admitting: *Deleted

## 2015-09-14 LAB — CBC WITH DIFFERENTIAL/PLATELET
BASOS: 0 %
Basophils Absolute: 0 10*3/uL (ref 0.0–0.2)
EOS (ABSOLUTE): 0 10*3/uL (ref 0.0–0.4)
EOS: 1 %
HEMATOCRIT: 47.5 % (ref 37.5–51.0)
Hemoglobin: 16.1 g/dL (ref 12.6–17.7)
IMMATURE GRANS (ABS): 0 10*3/uL (ref 0.0–0.1)
IMMATURE GRANULOCYTES: 0 %
LYMPHS: 56 %
Lymphocytes Absolute: 3.4 10*3/uL — ABNORMAL HIGH (ref 0.7–3.1)
MCH: 30.3 pg (ref 26.6–33.0)
MCHC: 33.9 g/dL (ref 31.5–35.7)
MCV: 89 fL (ref 79–97)
Monocytes Absolute: 0.5 10*3/uL (ref 0.1–0.9)
Monocytes: 8 %
NEUTROS ABS: 2.1 10*3/uL (ref 1.4–7.0)
NEUTROS PCT: 35 %
PLATELETS: 122 10*3/uL — AB (ref 150–379)
RBC: 5.32 x10E6/uL (ref 4.14–5.80)
RDW: 14.7 % (ref 12.3–15.4)
WBC: 6.1 10*3/uL (ref 3.4–10.8)

## 2015-09-14 LAB — COMPREHENSIVE METABOLIC PANEL
ALK PHOS: 61 IU/L (ref 39–117)
ALT: 21 IU/L (ref 0–44)
AST: 27 IU/L (ref 0–40)
Albumin/Globulin Ratio: 1.6 (ref 1.2–2.2)
Albumin: 4.2 g/dL (ref 3.5–5.5)
BUN/Creatinine Ratio: 11 (ref 9–20)
BUN: 16 mg/dL (ref 6–24)
Bilirubin Total: <0.2 mg/dL (ref 0.0–1.2)
CO2: 23 mmol/L (ref 18–29)
CREATININE: 1.48 mg/dL — AB (ref 0.76–1.27)
Calcium: 9.5 mg/dL (ref 8.7–10.2)
Chloride: 109 mmol/L — ABNORMAL HIGH (ref 96–106)
GFR calc Af Amer: 63 mL/min/{1.73_m2} (ref >59)
GFR calc non Af Amer: 54 mL/min/{1.73_m2} — ABNORMAL LOW (ref >59)
GLOBULIN, TOTAL: 2.6 g/dL (ref 1.5–4.5)
GLUCOSE: 80 mg/dL (ref 65–99)
Potassium: 4.5 mmol/L (ref 3.5–5.2)
SODIUM: 145 mmol/L — AB (ref 134–144)
Total Protein: 6.8 g/dL (ref 6.0–8.5)

## 2015-09-14 LAB — VALPROIC ACID LEVEL: Valproic Acid Lvl: 115 ug/mL — ABNORMAL HIGH (ref 50–100)

## 2015-09-14 NOTE — Telephone Encounter (Signed)
LVM informing patient his labs are stable , no changes in medication, treatment at this time. Left name, number.

## 2015-09-27 ENCOUNTER — Other Ambulatory Visit: Payer: Self-pay | Admitting: Diagnostic Neuroimaging

## 2015-12-19 ENCOUNTER — Telehealth: Payer: Self-pay | Admitting: Diagnostic Neuroimaging

## 2015-12-19 NOTE — Telephone Encounter (Signed)
Spoke with wife and reviewed patient's medication doses and times of day to take. She stated he is taking Topamax and Depakote as Dr Marjory LiesPenumalli prescribed, but she wanted clarification.  Informed her of his last platelet count. She stated he saw his PCP today, and his platelet count was 110.  Then this RN advised he may take the flu shot. She questioned interaction, and this RN stated there may be interaction with some antibiotics and cold medicines but not the flu vaccine. She then confirmed his FU next year. She verbalized understanding, appreciation for call.

## 2015-12-19 NOTE — Telephone Encounter (Signed)
Pt's wife called in to get clarification on how to take his medication. Can the pt have a flu shot? She would also like to know what his platelet count came back as.   Please call and advise [phone]

## 2015-12-20 NOTE — Telephone Encounter (Signed)
Agree with plan. --VRP 

## 2016-09-15 ENCOUNTER — Other Ambulatory Visit: Payer: Self-pay | Admitting: Diagnostic Neuroimaging

## 2016-09-16 ENCOUNTER — Encounter: Payer: Self-pay | Admitting: Diagnostic Neuroimaging

## 2016-09-16 ENCOUNTER — Ambulatory Visit: Payer: PRIVATE HEALTH INSURANCE | Admitting: Diagnostic Neuroimaging

## 2016-09-16 ENCOUNTER — Ambulatory Visit (INDEPENDENT_AMBULATORY_CARE_PROVIDER_SITE_OTHER): Payer: PRIVATE HEALTH INSURANCE | Admitting: Diagnostic Neuroimaging

## 2016-09-16 VITALS — BP 118/76 | HR 84 | Wt 202.2 lb

## 2016-09-16 DIAGNOSIS — G40909 Epilepsy, unspecified, not intractable, without status epilepticus: Secondary | ICD-10-CM | POA: Diagnosis not present

## 2016-09-16 DIAGNOSIS — R251 Tremor, unspecified: Secondary | ICD-10-CM

## 2016-09-16 MED ORDER — TOPAMAX 100 MG PO TABS: 100.0000 mg | ORAL_TABLET | Freq: Two times a day (BID) | ORAL | 4 refills | Status: DC

## 2016-09-16 MED ORDER — DEPAKOTE ER 500 MG PO TB24: 1500.0000 mg | ORAL_TABLET | Freq: Every day | ORAL | 4 refills | Status: DC

## 2016-09-16 NOTE — Progress Notes (Signed)
[facility]  PATIENT: Charles Powers DOB: 1965-04-28  REFERRING CLINICIAN:  HISTORY FROM: patient REASON FOR VISIT: follow up   HISTORICAL  CHIEF COMPLAINT:  Chief Complaint  Patient presents with  . Seizure disorder    rm 7, "no seizure activity"  . Follow-up    1 year    HISTORY OF PRESENT ILLNESS:   UPDATE 09/16/16: Since last visit, doing well. No sz. Tolerating meds. Has a physical exam with PCP coming up (in 1-2 months).   UPDATE 09/13/15: Since last visit doing well. No sz. No blood tests since last visit. Tremor stable.   UPDATE 01/11/15: Since last visit, doing well. No sz. Plts were improved in Aug 2016 lab check.  UPDATE 10/12/14: Since last visit, doing well. No sz. Tolerating lower depakote dose.   UPDATE 07/12/14: Since last visit, doing about the same. Plt levels still low (80). No seizures. No tremors.   UPDATE 10/26/13: Since last visit, tremors have resolved. Now on lower dose of depakote ER. Continues on TPX. No seizures. Doing well.   UPDATE 03/11/13: Since last visit, has been having more tremors, with handwriting and eating. This fluctuates. No clear pattern. 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   UPDATE 12/15/12: Since last visit patient is doing well. No further seizures. Last seizure was in July 2010. Patient is on Depakote ER 500 mg twice a day plus Depakote ER 250mg  at bedtime. He is also on Topamax 100 mg twice a day. Patient continues with fatigue, daytime sleepiness, bilateral upper extremity tremor.  PRIOR HPI (03/31/12, Dr. Sandria Manly): 51 year old right-handed African American married male from [place], [place] with a history of early morning nocturnal generalized major motor seizures beginning in April 1994. At that time his neurologic examination, sleep deprived EEGs, and  MRI study of brain were normal. After having 2 seizures and normal EEGs he was placed on  Dilantin medication but developed elevated liver function tests and in May of 1996 was changed to Depakote. His last CT 10/31/1997 was normal. 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 His weight ballooned on Depakote, 45 pounds to 211 pounds, then back down to 189 and now 211.5 pounds. He had episodes suggestive of myoclonic jerks. In 2002 he was begun on Topamax in addition to the Depakote. He has had generalized major motor seizures, 2 in 1994, one 11/04/1996, one 02/10/2000, one 03/18/2000,  one 2008, and one 09/07/2008. On Topamax he developed elevated creatinine and was seen by Dr. Geanie Berlin nephrologist. His last MRI study of the brain 04/04/2000  was normal with and without contrast. His last DEXA scan  09/12/2005  was normal. Blood studies 09/21/10=  WBC 7600, Hemoglobin 15.5, PLTC 91 K, sodium 141, potassium 4.7, chloride 111, CO2 28, BUN 16, Creatinine 1.6. Glucose 78, Valproic acid level 114.2, TSH 6.83. Repeat TSH 11/02/10, 3.08. CT scan of the brain without contrast was normal 09/07/2008. Medication schedule is Depakote ER brand name 500 mg 2 twice daily and  Depakote ER 250 mg one in the evening,Topamax brand name 100  milligrams one twice daily, and Synthroid 25 mcg once daily. Dr.Coladonato  does not feel his elevated creatinine is due to Topamax. patient denies any new symptoms. He denies dj vu, memory loss, hallucinations, delusions, depression, anxiety, or apathy . He does not exercise on a regular basis. Last blood levels 07/24/2011 were topiramate 7.6 and valproic acid 128. 09/19/2011  CBC and CMP were normal except platelet count 102K. He snores at night and he underwent polysomnogram 9/10/200  which was normal.   REVIEW OF SYSTEMS: Full 14 system review of systems performed and negative except: only as per HPI.   ALLERGIES: Allergies  Allergen Reactions  . Antihistamines, Chlorpheniramine-Type     Patient states wife does not like him to take    HOME MEDICATIONS: Outpatient  Medications Prior to Visit  Medication Sig Dispense Refill  . DEPAKOTE ER 500 MG 24 hr tablet Take 3 tablets (1,500 mg total) by mouth daily. 270 tablet 4  . levothyroxine (SYNTHROID, LEVOTHROID) 75 MCG tablet Take 75 mcg by mouth daily before breakfast.    . TOPAMAX 100 MG tablet Take 1 tablet (100 mg total) by mouth 2 (two) times daily. 180 tablet 4   No facility-administered medications prior to visit.     PAST MEDICAL HISTORY: Past Medical History:  Diagnosis Date  . Elevated creatine kinase   . Essential tremor   . Hypothyroidism   . Liver dysfunction    on Dilantin  . Seizures (HCC)   . Temporary low platelet count (HCC)     PAST SURGICAL HISTORY: Past Surgical History:  Procedure Laterality Date  . CATARACT EXTRACTION      FAMILY HISTORY: Family History  Problem Relation Age of Onset  . Diabetes Mother   . Hypertension Mother   . Cancer Mother   . Pancreatic cancer Mother   . Hypertension Father   . Epilepsy Unknown   . Cancer Unknown   . Cancer Sister        breast    SOCIAL HISTORY:  Social History   Social History  . Marital status: Married    Spouse name: Rosey Batheresa  . Number of children: 0  . Years of education: BA   Occupational History  .  [employer]   Social History Main Topics  . Smoking status: Never Smoker  . Smokeless tobacco: Never Used  . Alcohol use No  . Drug use: No  . Sexual activity: Not on file   Other Topics Concern  . Not on file   Social History Narrative   Patient lives at home with his spouse.   Caffeine Use: occasionally     PHYSICAL EXAM  Vitals:   09/16/16 1519  BP: 118/76  Pulse: 84  Weight: 202 lb 3.2 oz (91.7 kg)    Not recorded     Wt Readings from Last 3 Encounters:  09/16/16 202 lb 3.2 oz (91.7 kg)  09/13/15 212 lb 9.6 oz (96.4 kg)  01/11/15 203 lb (92.1 kg)   Body mass index is 33.65 kg/m.  GENERAL EXAM: Patient is in no distress  CARDIOVASCULAR: Regular rate and rhythm, no  murmurs, no carotid bruits  NEUROLOGIC: MENTAL STATUS: awake, alert, language fluent, comprehension intact, naming intact CRANIAL NERVE: pupils equal and reactive to light, visual fields full to confrontation, extraocular muscles intact, no nystagmus, facial sensation and strength symmetric, uvula midline, shoulder shrug symmetric, tongue midline. MOTOR: normal bulk and tone, full strength in the BUE, BLE; SUBTLE POSTURAL TREMOR OF BUE.  SENSORY: normal and symmetric to light touch, temperature, vibration COORDINATION: finger-nose-finger, fine finger movements normal REFLEXES: deep tendon reflexes present and symmetric GAIT/STATION: narrow based gait; TANDEM STABLE; romberg is negative    DIAGNOSTIC DATA (LABS, IMAGING, TESTING) - I reviewed patient records, labs, notes, testing and imaging myself where available.  CBC Latest Ref Rng & Units 09/13/2015 01/11/2015 10/12/2014  WBC 3.4 - 10.8 x10E3/uL 6.1 5.7 5.6  Hemoglobin 12.6 - 17.7 g/dL 16.1 09.6 04.5  Hematocrit 37.5 - 51.0 % 47.5 44.4 44.6  Platelets 150 - 379 x10E3/uL 122(L) 118(L) 112(L)       Component Value Date/Time   NA 145 (H) 09/13/2015 1130   K 4.5 09/13/2015 1130   CL 109 (H) 09/13/2015 1130   CO2 23 09/13/2015 1130   GLUCOSE 80 09/13/2015 1130   GLUCOSE 104 (H) 04/27/2010 0341   BUN 16 09/13/2015 1130   CREATININE 1.48 (H) 09/13/2015 1130   CALCIUM 9.5 09/13/2015 1130   PROT 6.8 09/13/2015 1130   ALBUMIN 4.2 09/13/2015 1130   AST 27 09/13/2015 1130   ALT 21 09/13/2015 1130   ALKPHOS 61 09/13/2015 1130   BILITOT <0.2 09/13/2015 1130   GFRNONAA 54 (L) 09/13/2015 1130   GFRAA 63 09/13/2015 1130   No results found for: CHOL No results found for: HGBA1C No results found for: VITAMINB12 No results found for: TSH No results found for: PHENYTOIN  09/15/08 EEG - right central-temporal epileptiform activity C4, T4  07/24/11 VPA 128  11/27/12 VPA 131     ASSESSMENT AND PLAN  51 y.o. 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 Doing well on Depakote ER + Topamax from seizure control standpoint. Need to monitor platelets, which could be low due to long term depakote use. Mild postural tremor is stable. Last seizure July 2010.   Dx:  Seizure disorder (HCC)  Tremor    PLAN:  I spent 15 minutes of face to face time with patient. Greater than 50% of time was spent in counseling and coordination of care with patient. In summary we discussed:  - continue depakote ER 1500mg  qhs (BRAND MEDICALLY NECESSARY)  - continue TPX 100mg  BID (BRAND MEDICALLY NECESSARY)  - check labs per PCP annually (follow up mild low platelet levels; could be related to depakote but stable for now)  Meds ordered this encounter  Medications  . TOPAMAX 100 MG tablet    Sig: Take 1 tablet (100 mg total) by mouth 2 (two) times daily.    Dispense:  180 tablet    Refill:  4    BRAND MEDICALLY NECESSARY  . DEPAKOTE ER 500 MG 24 hr tablet    Sig: Take 3 tablets (1,500 mg total) by mouth daily.    Dispense:  270 tablet    Refill:  4    BRAND MEDICALLY NECESSARY   Return in about 1 year (around 09/16/2017) for with NP Eber Jones or Raymond).    Suanne Marker, MD 09/16/2016, 4:17 PM Certified in Neurology, Neurophysiology and Neuroimaging  [facility] [address] [place], [place] [address] [phone]

## 2017-03-18 ENCOUNTER — Telehealth: Payer: Self-pay | Admitting: Diagnostic Neuroimaging

## 2017-03-18 NOTE — Telephone Encounter (Signed)
Carollee [facility]Shannon with [facility] is calling to advise DEPAKOTE ER 500 MG 24 hr tablet brand name is on back order and wants to know if patient can be given generic.

## 2017-03-19 ENCOUNTER — Encounter: Payer: Self-pay | Admitting: Diagnostic Neuroimaging

## 2017-03-19 NOTE — Telephone Encounter (Addendum)
Called [facility] [employer], spoke with Antoinette, pharmacist. She stated that they have Depakote ER 500 mg, 50 tablets available; she can order from warehouse, and shipment should arrive in 24 hours.  This RN gave her verbal order for Depakote ER 500 mg tabs, brand only, disp #270, 0 refills. Sherry Ruffingntoinette stated she will give patient enough tabs for 3 days of medication until shipment comes in. Antoinette repeated Rx correctly.  Called patient's wife and informed her of above. She verbalized understanding, appreciation.

## 2017-03-19 NOTE — Telephone Encounter (Signed)
Pt wife has called back to [employer] [facility] [employer] stating pt would very much like to stay on brand name of medicaion, she is asking if Rx can be called into  [id number] [facility] [place], [place] - [address] [phone] (Phone) [phone] (Fax)   Please call pt if unable to get medication through them

## 2017-03-19 NOTE — Telephone Encounter (Signed)
LVM requesting patient call back to advise how much Depakote he has left. Advised him his pharmacy stated brand Depakote is on back order. Left office number.

## 2017-06-16 ENCOUNTER — Other Ambulatory Visit: Payer: Self-pay | Admitting: Diagnostic Neuroimaging

## 2017-06-17 ENCOUNTER — Other Ambulatory Visit: Payer: Self-pay | Admitting: Diagnostic Neuroimaging

## 2017-09-16 NOTE — Progress Notes (Signed)
[facility]  PATIENT: Charles Powers DOB: 08-17-1965   REASON FOR VISIT: Follow-up seizure disorder HISTORY FROM: Patient    HISTORY OF PRESENT ILLNESS:UPDATE 8/7/2019CM Charles Powers, 52 year old male returns for follow-up with history of seizure disorder.  Last seizure in 2010.  He is currently on brand Depakote and brand Topamax.  He was recently seen for plantar fasciitis and that is improving.  He has follow-up with his primary care for his labs he has had low platelets in the past.  He returns for reevaluation UPDATE 09/16/16: Since last visit, doing well. No sz. Tolerating meds. Has a physical exam with PCP coming up (in 1-2 months).   UPDATE 09/13/15: Since last visit doing well. No sz. No blood tests since last visit. Tremor stable.   UPDATE 01/11/15: Since last visit, doing well. No sz. Plts were improved in Aug 2016 lab check.  UPDATE 10/12/14: Since last visit, doing well. No sz. Tolerating lower depakote dose.   UPDATE 07/12/14: Since last visit, doing about the same. Plt levels still low (80). No seizures. No tremors.   UPDATE 10/26/13: Since last visit, tremors have resolved. Now on lower dose of depakote ER. Continues on TPX. No seizures. Doing well.   UPDATE 03/11/13: Since last visit, has been having more tremors, with handwriting and eating. This fluctuates. No clear pattern. 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   UPDATE 12/15/12: Since last visit patient is doing well. No further seizures. Last seizure was in July 2010. Patient is on Depakote ER 500 mg twice a day plus Depakote ER 250mg  at bedtime. He is also on Topamax 100 mg twice a day. Patient continues with fatigue, daytime sleepiness, bilateral upper extremity tremor.  PRIOR HPI (03/31/12, Dr. Sandria Manly): 52 year old right-handed African American married male from [place], [place] with a history of early morning nocturnal  generalized major motor seizures beginning in April 1994. At that time his neurologic examination, sleep deprived EEGs, and  MRI study of brain were normal. After having 2 seizures and normal EEGs he was placed on Dilantin medication but developed elevated liver function tests and in May of 1996 was changed to Depakote. His last CT 10/31/1997 was normal. 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 His weight ballooned on Depakote, 45 pounds to 211 pounds, then back down to 189 and now 211.5 pounds. He had episodes suggestive of myoclonic jerks. In 2002 he was begun on Topamax in addition to the Depakote. He has had generalized major motor seizures, 2 in 1994, one 11/04/1996, one 02/10/2000, one 03/18/2000,  one 2008, and one 09/07/2008. On Topamax he developed elevated creatinine and was seen by Dr. Geanie Berlin nephrologist. His last MRI study of the brain 04/04/2000  was normal with and without contrast. His last DEXA scan  09/12/2005  was normal. Blood studies 09/21/10=  WBC 7600, Hemoglobin 15.5, PLTC 91 K, sodium 141, potassium 4.7, chloride 111, CO2 28, BUN 16, Creatinine 1.6. Glucose 78, Valproic acid level 114.2, TSH 6.83. Repeat TSH 11/02/10, 3.08. CT scan of the brain without contrast was normal 09/07/2008. Medication schedule is Depakote ER brand name 500 mg 2 twice daily and  Depakote ER 250 mg one in the evening,Topamax brand name 100  milligrams one twice daily, and Synthroid 25 mcg once daily. Dr.Coladonato  does not feel his elevated creatinine is due to Topamax. patient denies any new symptoms. He denies dj vu, memory loss, hallucinations, delusions,  depression, anxiety, or apathy . He does not exercise on a regular basis. Last blood levels 07/24/2011 were topiramate 7.6 and valproic acid 128. 09/19/2011  CBC and CMP were normal except platelet count 102K. He snores at night and he underwent polysomnogram 9/10/200  which was normal.    REVIEW OF SYSTEMS: Full 14 system review of systems performed  and notable only for those listed, all others are neg:  Constitutional: neg  Cardiovascular: neg Ear/Nose/Throat: neg  Skin: neg Eyes: neg Respiratory: neg Gastroitestinal: neg  Hematology/Lymphatic: neg  Endocrine: neg Musculoskeletal:neg Allergy/Immunology: neg Neurological: History of seizure disorder Psychiatric: neg Sleep : neg   ALLERGIES: Allergies  Allergen Reactions  . Antihistamines, Chlorpheniramine-Type     Patient states wife does not like him to take    HOME MEDICATIONS: Outpatient Medications Prior to Visit  Medication Sig Dispense Refill  . DEPAKOTE ER 500 MG 24 hr tablet TAKE 3 TABLETS BY MOUTH DAILY 270 tablet 0  . levothyroxine (SYNTHROID, LEVOTHROID) 75 MCG tablet Take 75 mcg by mouth daily before breakfast.    . meloxicam (MOBIC) 15 MG tablet 15 mg daily.     . TOPAMAX 100 MG tablet Take 1 tablet (100 mg total) by mouth 2 (two) times daily. 180 tablet 4   No facility-administered medications prior to visit.     PAST MEDICAL HISTORY: Past Medical History:  Diagnosis Date  . Elevated creatine kinase   . Essential tremor   . Hypothyroidism   . Liver dysfunction    on Dilantin  . Seizures (HCC)   . Temporary low platelet count (HCC)     PAST SURGICAL HISTORY: Past Surgical History:  Procedure Laterality Date  . CATARACT EXTRACTION      FAMILY HISTORY: Family History  Problem Relation Age of Onset  . Diabetes Mother   . Hypertension Mother   . Cancer Mother   . Pancreatic cancer Mother   . Hypertension Father   . Epilepsy Unknown   . Cancer Unknown   . Cancer Sister        breast    SOCIAL HISTORY: Social History   Socioeconomic History  . Marital status: Married    Spouse name: Rosey Batheresa  . Number of children: 0  . Years of education: BA  . Highest education level: Not on file  Occupational History    Employer: [employer]  Social Needs  . Financial resource strain: Not on file  . Food insecurity:    Worry: Not on  file    Inability: Not on file  . Transportation needs:    Medical: Not on file    Non-medical: Not on file  Tobacco Use  . Smoking status: Never Smoker  . Smokeless tobacco: Never Used  Substance and Sexual Activity  . Alcohol use: No  . Drug use: No  . Sexual activity: Not on file  Lifestyle  . Physical activity:    Days per week: Not on file    Minutes per session: Not on file  . Stress: Not on file  Relationships  . Social connections:    Talks on phone: Not on file    Gets together: Not on file    Attends religious service: Not on file    Active member of club or organization: Not on file    Attends meetings of clubs or organizations: Not on file    Relationship status: Not on file  . Intimate partner violence:    Fear of current or ex partner: Not  on file    Emotionally abused: Not on file    Physically abused: Not on file    Forced sexual activity: Not on file  Other Topics Concern  . Not on file  Social History Narrative   Patient lives at home with his spouse.   Caffeine Use: occasionally     PHYSICAL EXAM  Vitals:   09/17/17 1525  BP: 118/76  Pulse: 75  Weight: 207 lb (93.9 kg)  Height: 5\' 5"  (1.651 m)   Body mass index is 34.45 kg/m.  Generalized: Well developed, in no acute distress  Head: normocephalic and atraumatic,. Oropharynx benign  Neck: Supple,   Musculoskeletal: No deformity   Neurological examination   Mentation: Alert oriented to time, place, history taking. Attention span and concentration appropriate. Recent and remote memory intact.  Follows all commands speech and language fluent.   Cranial nerve II-XII: Pupils were equal round reactive to light extraocular movements were full, visual field were full on confrontational test. Facial sensation and strength were normal. hearing was intact to finger rubbing bilaterally. Uvula tongue midline. head turning and shoulder shrug were normal and symmetric.Tongue protrusion into cheek strength  was normal. Motor: normal bulk and tone, full strength in the BUE, BLE,  Sensory: normal and symmetric to light touch,   Coordination: finger-nose-finger, heel-to-shin bilaterally, no dysmetria, mild essential tremor Reflexes: Symmetric upper and lower, plantar responses were flexor bilaterally. Gait and Station: Rising up from seated position without assistance, normal stance,  moderate stride, good arm swing, smooth turning, able to perform tiptoe, and heel walking without difficulty. Tandem gait is steady  DIAGNOSTIC DATA (LABS, IMAGING, TESTING) - I reviewed patient records, labs, notes, testing and imaging myself where available.  Lab Results  Component Value Date   WBC 6.1 09/13/2015   HGB 16.1 09/13/2015   HCT 47.5 09/13/2015   MCV 89 09/13/2015   PLT 122 (L) 09/13/2015      Component Value Date/Time   NA 145 (H) 09/13/2015 1130   K 4.5 09/13/2015 1130   CL 109 (H) 09/13/2015 1130   CO2 23 09/13/2015 1130   GLUCOSE 80 09/13/2015 1130   GLUCOSE 104 (H) 04/27/2010 0341   BUN 16 09/13/2015 1130   CREATININE 1.48 (H) 09/13/2015 1130   CALCIUM 9.5 09/13/2015 1130   PROT 6.8 09/13/2015 1130   ALBUMIN 4.2 09/13/2015 1130   AST 27 09/13/2015 1130   ALT 21 09/13/2015 1130   ALKPHOS 61 09/13/2015 1130   BILITOT <0.2 09/13/2015 1130   GFRNONAA 54 (L) 09/13/2015 1130   GFRAA 63 09/13/2015 1130    ASSESSMENT AND PLAN 52 y.o. 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 Doing well on Depakote ER + Topamax from seizure control standpoint. Need to monitor platelets, which could be low due to long term depakote use. Mild postural tremor is stable. Last seizure July 2010.     PLAN:Continue Topamax 100mg   twice daily BRAND will refill Continue Depakote 500mg  3 tablets by mouth daily BRAND Call for any seizure activity Labs follow through primary care has had low platelets in the past Follow-up  yearly and as needed Nilda Riggs, [facility], [facility] APRN  [facility] [address] [place], [place] [address] [phone]

## 2017-09-17 ENCOUNTER — Ambulatory Visit: Payer: PRIVATE HEALTH INSURANCE | Admitting: Nurse Practitioner

## 2017-09-17 ENCOUNTER — Encounter: Payer: Self-pay | Admitting: Nurse Practitioner

## 2017-09-17 VITALS — BP 118/76 | HR 75 | Ht 65.0 in | Wt 207.0 lb

## 2017-09-17 DIAGNOSIS — R251 Tremor, unspecified: Secondary | ICD-10-CM | POA: Diagnosis not present

## 2017-09-17 DIAGNOSIS — G40909 Epilepsy, unspecified, not intractable, without status epilepticus: Secondary | ICD-10-CM | POA: Diagnosis not present

## 2017-09-17 MED ORDER — DEPAKOTE ER 500 MG PO TB24: 1500.0000 mg | ORAL_TABLET | Freq: Every day | ORAL | 3 refills | Status: DC

## 2017-09-17 MED ORDER — TOPAMAX 100 MG PO TABS: 100.0000 mg | ORAL_TABLET | Freq: Two times a day (BID) | ORAL | 3 refills | Status: DC

## 2017-09-17 NOTE — Patient Instructions (Signed)
Continue Topamax 1 tablet twice daily BRAND will refill Continue Depakote 500mg  3 tablets by mouth daily Call for any seizure activity Labs follow through primary care has had low platelets in the past Follow-up yearly and as needed

## 2017-09-18 NOTE — Progress Notes (Signed)
I reviewed note and agree with plan.   Suanne MarkerVIKRAM R. PENUMALLI, MD 09/18/2017, 11:52 AM Certified in Neurology, Neurophysiology and Neuroimaging  [facility] [address], [address] [place], [place] [id number] [phone]

## 2017-11-10 ENCOUNTER — Other Ambulatory Visit: Payer: Self-pay | Admitting: Diagnostic Neuroimaging

## 2017-12-16 ENCOUNTER — Other Ambulatory Visit: Payer: Self-pay | Admitting: Diagnostic Neuroimaging

## 2018-03-23 ENCOUNTER — Telehealth: Payer: Self-pay | Admitting: Diagnostic Neuroimaging

## 2018-03-23 NOTE — Telephone Encounter (Signed)
Pt said topamax and depakote name brand is not covered under insurance anymore and it will need a DAW (I think it needs PA).  She said the pharmacy told her a form has been sent, has it been rec'd. Please call to advise

## 2018-03-24 NOTE — Telephone Encounter (Signed)
I spoke to wife.  Relayed have not received anything from pharmacy.  Will do PA on Brand Name Depakote and Topamax.  Prescriptions DAW was noted on prescriptions.

## 2018-03-24 NOTE — Telephone Encounter (Signed)
Initiated PA on both topamax and depakote.  Received fax per [employer] [facility] that both medications are listed as covered drugs.  REF # [id number] depakote, PA [id number] topamax.  Fax confirmation received [phone].

## 2018-03-25 NOTE — Telephone Encounter (Signed)
LMVM for pts wife that returned call, LM of below re: last conversation with Natalia Leatherwood at [facility]  She is to call back if needed.

## 2018-03-25 NOTE — Telephone Encounter (Signed)
Pt wife has returned call to [facility], she is asking for a call back

## 2018-03-25 NOTE — Telephone Encounter (Signed)
I spoke to wife of pt and relayed to her. I called and spoke to Genuine Partskatherine Pharmacy Tech with [facility].  She stated that there is a new policy for [employer] patients that a DUR (she was not sure what it stood for).  But if pts requiring BM medications , pt will have to pt difference in cost.  DUR forms are needed.  She stated that she would email [employer] who runs claims to see if gets approval.  No PA is required.  May possibly require something from us at a later time if not approved.

## 2018-03-26 NOTE — Telephone Encounter (Signed)
Spoke to [employer] clinical pharmacist PA for cost reduction on depakote ER.  He will fax form, asking what kind of sz.

## 2018-03-26 NOTE — Telephone Encounter (Signed)
Pt returned call. Please call back as soon as available.  °

## 2018-03-26 NOTE — Telephone Encounter (Signed)
Spoke to pt wife.  Will call and DUR forms.

## 2018-03-30 NOTE — Telephone Encounter (Signed)
Received note for denial for cost reduction.

## 2018-03-30 NOTE — Telephone Encounter (Signed)
Pt called with Kara/[employer] [facility] (she would not leave a phone number) (Ref # [id number] for the call )on the other line, is asking for appeal for this medication. Otherwise it cost $172/mth  Can email to: [email or web address]

## 2018-03-31 ENCOUNTER — Encounter: Payer: Self-pay | Admitting: *Deleted

## 2018-03-31 NOTE — Telephone Encounter (Signed)
Pt's wife Rosey Bath is requesting a call back from Rn to discuss message below.

## 2018-03-31 NOTE — Telephone Encounter (Signed)
According to the chart primary generalized epilepsy

## 2018-03-31 NOTE — Telephone Encounter (Signed)
Appeal letter done, sent for review and signature.

## 2018-04-01 NOTE — Telephone Encounter (Signed)
I called and spoke to [place] re: topamax relating to cancelling fee (penalty fee (extra fee difference of BN vs generic). For BN Topamax.  Answered questions relating to this, will go to reiview.  Auth# [id number].  I then called [phone] relating to appeal for BN depakote.  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  [id number].  Refaxed letter of appeal to [phone] with confirmation.

## 2018-04-02 NOTE — Telephone Encounter (Signed)
I called wife of pt.  I relayed that I have spoken to [facility] on BN topamax, BN depakote cost reduction.  Did receive another form about depakote, will complete and send back to them [phone].  Fax confirmation received.

## 2018-04-09 ENCOUNTER — Telehealth: Payer: Self-pay | Admitting: Diagnostic Neuroimaging

## 2018-04-09 NOTE — Telephone Encounter (Signed)
Pts wife called stating she needs a call back from the provider regarding her husbands name brand medication problem. She does not understand why [facility] is not approving pts DEPAKOTE ER 500 MG 24 hr tablet and TOPAMAX 100 MG tablet  when it has always been approved. She would like to know if a letter or something can be written up so that the medicine can be approved. Pleas advise.

## 2018-04-13 NOTE — Telephone Encounter (Addendum)
I called wife of pt.  I relayed that received denial for depakote BN cost reduction even after appeal letter written.  Dr. Marjory Lies made aware.  (review chart/ letter).  Unfortunately, insurance states that pt responsible for difference of BN vs generic since he has not tried generics before.  She did not know cost of what topamax BN would be as no refill needed as yet.  Pt has Production assistant, radio.  I recommended her to get online for discount card for [facility] for both topamax and depakote.  I was able to get card for depakote and topamax and fax to pharmacy to see if this would help cost.  Fax confirmation received [facility] outpt pharmacy. [phone], [phone].

## 2018-04-13 NOTE — Telephone Encounter (Signed)
See previous phone note; Andrey Campanile RN called wife today.

## 2018-04-20 MED ORDER — TOPAMAX 100 MG PO TABS: 100.0000 mg | ORAL_TABLET | Freq: Two times a day (BID) | ORAL | 3 refills | Status: DC

## 2018-04-20 MED ORDER — DEPAKOTE ER 500 MG PO TB24: 1500.0000 mg | ORAL_TABLET | Freq: Every day | ORAL | 3 refills | Status: DC

## 2018-04-20 NOTE — Telephone Encounter (Signed)
Pt's wife said the cards did not help with the cost of the drugs. She is asking for script for both meds be sent to [facility]. She said hopefully it will be cheaper. Please call to advise at [phone]

## 2018-04-20 NOTE — Telephone Encounter (Signed)
90 day supply ordered at [facility].

## 2018-04-20 NOTE — Telephone Encounter (Signed)
I spoke to wife of pt.  She stated that the savings card for her of the BN meds: topamax and depakote are applied and is still too expensive. She will try [facility], I relayed that I will send prescriptions there for them to see how much they will be.  She had called and spoke to several people (with different answers).  Wife verbalized understanding. She will let us know.

## 2018-04-20 NOTE — Addendum Note (Signed)
Addended by: Guy Begin on: 04/20/2018 02:58 PM   Modules accepted: Orders

## 2018-05-20 ENCOUNTER — Telehealth: Payer: Self-pay | Admitting: Nurse Practitioner

## 2018-05-20 ENCOUNTER — Encounter: Payer: Self-pay | Admitting: Nurse Practitioner

## 2018-05-20 ENCOUNTER — Encounter: Payer: Self-pay | Admitting: *Deleted

## 2018-05-20 NOTE — Telephone Encounter (Signed)
Noted. -VRP 

## 2018-05-20 NOTE — Telephone Encounter (Addendum)
Received call from [facility] pharmacist, [facility] who stated the patient's Topamax will cost ~ $1000/ 90 days. 60 days is ~ $300. He stated the cost is $24/ 90 day supply for generic topiramate. He stated that he has discussed changing to generic with wife. They gave pt 14 days supply and put him on payment plan until this is resolved.  I advised Trinna Post of previous conversations with wife and actions taken to try and get cost reduction on Depakote and Topamax. I advised him I wrote appeal letter today to try and get cost reduction for Topamax, will fax when signed. He stated he will talk to manager about extending another 14 days until resolved.  He stated he would call back in a few days to check on appeal letter. I gave him specifics of why insurance denied cost reduction of topiramate. He noted that information, thanked me for additional information.

## 2018-05-20 NOTE — Telephone Encounter (Addendum)
Wife called back and stated the [facility] only gave $100 discount and Topamax BN will cost $1000/ 3 months at [facility] pharmacy which is pharmacy his insurance prefers. We discussed her previous conversations with [facility] in Feb, March. She does not prefer he try generic which is what insurance requires. She stated he may have an event which could be life changing. She is asking for Dr Marjory Lies to advise. I advised will discuss with him and let her know. She verbalized understanding, appreciation.

## 2018-05-20 NOTE — Telephone Encounter (Signed)
Pt wife(on DPR ) has called re: a pricing issue with pt's TOPAMAX 100 MG tablet she is asking for a call to discuss

## 2018-05-20 NOTE — Telephone Encounter (Signed)
LVM for wife requesting call back.  ?

## 2018-06-01 NOTE — Telephone Encounter (Signed)
Pt's wife called with some more questions about the pts medication. Please call back as soon as available.

## 2018-06-01 NOTE — Telephone Encounter (Signed)
Received fax from [facility] asking for additional information re: trial of topiramate. Laqueta Due, pharmacist w/ [facility] but he is not in today. Will call him tomorrow to see if in patient's Rx history he has ever received generic topiramate. Marland Kitchen

## 2018-06-01 NOTE — Telephone Encounter (Addendum)
Called wife who asked for status of Topamax appeal. I advised her [facility] has still rejected a cost reduction based on patient not having tried/failed generic topiramate from 2 different manufacturers.  I advised her of conversation with pharmacist on 05/20/18 and cost differences.  I advised her that there is no further action on our part to appeal. She asked for patient to have follow up with Dr Marjory Lies to further discuss changing medications. She is concerned about break through seizures. I advised her that due to current COVID 19 pandemic, our office is severely reducing in person visits in order to minimize the risk to our patients and healthcare providers. We can schedule a video visit. We'll take all precautions to reduce any security or privacy concerns. This will be treated like an office visit, and we will file with your insurance. She consented to video visit. Pt's email is  [email or web address]. She understands that the [employer] webex software must be downloaded and operational on the device pt plans to use for the visit.  We updated EMR and scheduled video visit for Thursday. She will try to be home from work. She had no further questions,  verbalized understanding, appreciation. Webex scheduled; e mail sent.

## 2018-06-01 NOTE — Telephone Encounter (Signed)
Topamax 100 mg (brand) only appeal letter for cost reduction faxed to [employer], [phone].  REF # [id number] Pt ID # [id number]

## 2018-06-03 NOTE — Telephone Encounter (Signed)
Received fax from [facility] re: Topamax 100 mg brand name approved for cost reduction based on appeal. Approved for 12 months through 06/02/2019. Called patient and LVM advising of approval and advised he call pharmacy to refill and check on cost. Left office number for any questions.

## 2018-06-04 ENCOUNTER — Ambulatory Visit: Payer: Self-pay | Admitting: Diagnostic Neuroimaging

## 2018-06-04 ENCOUNTER — Encounter: Payer: Self-pay | Admitting: *Deleted

## 2018-06-04 NOTE — Telephone Encounter (Signed)
LVM requesting call back re: phone staff stated wife had called. I was on [phone at the time, couldn't take her call.

## 2018-06-04 NOTE — Telephone Encounter (Signed)
Pt's wife called back. RN not available. Please call back as soon as available.

## 2018-06-04 NOTE — Telephone Encounter (Addendum)
Wife called back and expressed thanks for obtaining price reduction on Topamax to $60 every 3 months. Pharmacist told her they could also use a coupon to further reduce cost. She stated his FU this afternoon can be cancelled. She then asked if the same process could be used to lower cost of Depakote, currently costing ~ $260/ month. I advised her will look into previous denial of Depakote cost reduction and let her know if further steps can be taken. She verbalized understanding, appreciation. Video visit this afternoon cancelled.  Per Depakote appeal denial letter in Feb, a 2nd appeal can be sent within 6 months. Letter composed, on Dr [employer] desk for review, signature.

## 2018-06-11 NOTE — Telephone Encounter (Signed)
Depakote appeal letter signed and faxed to [facility] appeals dept. F [phone].

## 2018-06-15 NOTE — Telephone Encounter (Addendum)
Received fax from [facility]: Depakote ER 500 mg tabs approved through 06/12/2019. Approval letter faxed to [facility] pharmacy [phone].

## 2018-09-08 ENCOUNTER — Other Ambulatory Visit: Payer: Self-pay | Admitting: Diagnostic Neuroimaging

## 2018-09-10 ENCOUNTER — Telehealth: Payer: Self-pay | Admitting: Nurse Practitioner

## 2018-09-10 NOTE — Telephone Encounter (Signed)
LVM requesting call back to discuss if he is wanting this change to be made. Topamax was refilled in March to [facility]. Advised our office is closed on Fri due to C19.

## 2018-09-10 NOTE — Telephone Encounter (Signed)
Pharmacist Mitch from [facility] states they have mail order there.  He is asking for a mail order script for TOPAMAX 100 MG tablet-[facility]

## 2018-09-21 ENCOUNTER — Encounter: Payer: Self-pay | Admitting: Diagnostic Neuroimaging

## 2018-09-21 ENCOUNTER — Ambulatory Visit: Payer: PRIVATE HEALTH INSURANCE | Admitting: Diagnostic Neuroimaging

## 2018-09-21 ENCOUNTER — Other Ambulatory Visit: Payer: Self-pay

## 2018-09-21 VITALS — BP 130/88 | HR 91 | Temp 98.0°F | Ht 66.0 in | Wt 218.0 lb

## 2018-09-21 DIAGNOSIS — G40909 Epilepsy, unspecified, not intractable, without status epilepticus: Secondary | ICD-10-CM

## 2018-09-21 MED ORDER — DEPAKOTE ER 500 MG PO TB24: 1500.0000 mg | ORAL_TABLET | Freq: Every day | ORAL | 3 refills | Status: DC

## 2018-09-21 MED ORDER — TOPAMAX 100 MG PO TABS: 100.0000 mg | ORAL_TABLET | Freq: Two times a day (BID) | ORAL | 3 refills | Status: DC

## 2018-09-21 NOTE — Progress Notes (Signed)
History of Present Illness:  UPDATE (09/21/18, VRP): Since last visit, doing well. No seizures. No alleviating or aggravating factors. Tolerating meds.     UPDATE 09/16/16: Since last visit, doing well. No sz. Tolerating meds. Has a physical exam with PCP coming up (in 1-2 months).   UPDATE 09/13/15: Since last visit doing well. No sz. No blood tests since last visit. Tremor stable.   UPDATE 10/12/14: Since last visit, doing well. No sz. Tolerating lower depakote dose.   UPDATE 07/12/14: Since last visit, doing about the same. Plt levels still low (80). No seizures. No tremors.   UPDATE 10/26/13: Since last visit, tremors have resolved. Now on lower dose of depakote ER. Continues on TPX. No seizures. Doing well.   UPDATE 03/11/13: Since last visit, has been having more tremors, with handwriting and eating. This fluctuates. No clear pattern. 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   UPDATE 12/15/12: Since last visit patient is doing well. No further seizures. Last seizure was in July 2010. Patient is on Depakote ER 500 mg twice a day plus Depakote ER 250mg  at bedtime. He is also on Topamax 100 mg twice a day. Patient continues with fatigue, daytime sleepiness, bilateral upper extremity tremor.  PRIOR HPI (03/31/12, Dr. Sandria ManlyLove): 53 year old right-handed African American married [place] with a history of early morning nocturnal generalized major motor seizures beginning in April 1994. At that time his neurologic examination, sleep deprived EEGs, and  MRI study of brain were normal. After having 2 seizures and normal EEGs he was placed on Dilantin medication but developed elevated liver function tests and in May of 1996 was changed to Depakote. His last CT 10/31/1997 was normal. 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 His weight ballooned on Depakote, 45 pounds to 211 pounds, then back down to 189 and  now 211.5 pounds. He had episodes suggestive of myoclonic jerks. In 2002 he was begun on Topamax in addition to the Depakote. He has had generalized major motor seizures, 2 in 1994, one 11/04/1996, one 02/10/2000, one 03/18/2000,  one 2008, and one 09/07/2008. On Topamax he developed elevated creatinine and was seen by Dr. Geanie Berlinolodanato nephrologist. His last MRI study of the brain 04/04/2000  was normal with and without contrast. His last DEXA scan  09/12/2005  was normal. Blood studies 09/21/10=  WBC 7600, Hemoglobin 15.5, PLTC 91 K, sodium 141, potassium 4.7, chloride 111, CO2 28, BUN 16, Creatinine 1.6. Glucose 78, Valproic acid level 114.2, TSH 6.83. Repeat TSH 11/02/10, 3.08. CT scan of the brain without contrast was normal 09/07/2008. Medication schedule is Depakote ER brand name 500 mg 2 twice daily and  Depakote ER 250 mg one in the evening,Topamax brand name 100  milligrams one twice daily, and Synthroid 25 mcg once daily. Dr.Coladonato  does not feel his elevated creatinine is due to Topamax. patient denies any new symptoms. He denies dj vu, memory loss, hallucinations, delusions, depression, anxiety, or apathy . He does not exercise on a regular basis. Last blood levels 07/24/2011 were topiramate 7.6 and valproic acid 128. 09/19/2011  CBC and CMP were normal except platelet count 102K. He snores at night and he underwent polysomnogram 9/10/200  which was normal.    Observations/Objective:  GENERAL EXAM/CONSTITUTIONAL: Vitals:  Vitals:   09/21/18 1333  BP: 130/88  Pulse: 91  Temp: 98 F (36.7 C)  Weight: 218 lb (98.9 kg)  Height:  5\' 6"  (1.676 m)     Body mass index is 35.19 kg/m. Wt Readings from Last 3 Encounters:  09/21/18 218 lb (98.9 kg)  09/17/17 207 lb (93.9 kg)  09/16/16 202 lb 3.2 oz (91.7 kg)     Patient is in no distress; well developed, nourished and groomed; neck is supple  CARDIOVASCULAR:  Examination of carotid arteries is normal; no carotid bruits  Regular rate and  rhythm, no murmurs  Examination of peripheral vascular system by observation and palpation is normal  EYES:  Ophthalmoscopic exam of optic discs and posterior segments is normal; no papilledema or hemorrhages  No exam data present  MUSCULOSKELETAL:  Gait, strength, tone, movements noted in Neurologic exam below  NEUROLOGIC: MENTAL STATUS:  No flowsheet data found.  awake, alert, oriented to person, place and time  recent and remote memory intact  normal attention and concentration  language fluent, comprehension intact, naming intact  fund of knowledge appropriate  CRANIAL NERVE:   2nd - no papilledema on fundoscopic exam  2nd, 3rd, 4th, 6th - pupils equal and reactive to light, visual fields full to confrontation, extraocular muscles intact, no nystagmus  5th - facial sensation symmetric  7th - facial strength symmetric  8th - hearing intact  9th - palate elevates symmetrically, uvula midline  11th - shoulder shrug symmetric  12th - tongue protrusion midline  MOTOR:   normal bulk and tone, full strength in the BUE, BLE  SENSORY:   normal and symmetric to light touch   COORDINATION:   finger-nose-finger, fine finger movements normal  REFLEXES:   deep tendon reflexes TRACE and symmetric  GAIT/STATION:   narrow based gait    Assessment and Plan:  53 y.o. 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 Doing well on Depakote ER + Topamax from seizure control standpoint. Need to monitor platelets, which could be low due to long term depakote use. Mild postural tremor is stable. Last seizure July 2010.    Dx:  1. Seizure disorder [facility])     PLAN:   Meds ordered this encounter  Medications  . DEPAKOTE ER 500 MG 24 hr tablet    Sig: Take 3 tablets (1,500 mg total) by mouth daily. BRAND MEDICALLY NECESSARY.    Dispense:  270 tablet    Refill:  3  . TOPAMAX 100 MG tablet     Sig: Take 1 tablet (100 mg total) by mouth 2 (two) times daily.    Dispense:  180 tablet    Refill:  3    BRAND MEDICALLY NECESSARY   Orders Placed This Encounter  Procedures  . CBC with Differential/Platelet  . Comprehensive metabolic panel    Follow Up Instructions:  - Return in about 9 months (around 06/21/2019).    I discussed the assessment and treatment plan with the patient. The patient was provided an opportunity to ask questions and all were answered. The patient agreed with the plan and demonstrated an understanding of the instructions.   The patient was advised to call back or seek an in-person evaluation if the symptoms worsen or if the condition fails to improve as anticipated.  I provided 15 minutes of face-to-face time during this encounter.   Penni Bombard, MD 4/40/3474, 2:59 PM Certified in Neurology, Neurophysiology and Neuroimaging  [facility] [address], [place], [place] [address] [phone]

## 2018-09-22 LAB — COMPREHENSIVE METABOLIC PANEL
ALT: 19 IU/L (ref 0–44)
AST: 24 IU/L (ref 0–40)
Albumin/Globulin Ratio: 1.8 (ref 1.2–2.2)
Albumin: 4.4 g/dL (ref 3.8–4.9)
Alkaline Phosphatase: 67 IU/L (ref 39–117)
BUN/Creatinine Ratio: 11 (ref 9–20)
BUN: 19 mg/dL (ref 6–24)
Bilirubin Total: 0.2 mg/dL (ref 0.0–1.2)
CO2: 20 mmol/L (ref 20–29)
Calcium: 9.8 mg/dL (ref 8.7–10.2)
Chloride: 107 mmol/L — ABNORMAL HIGH (ref 96–106)
Creatinine, Ser: 1.7 mg/dL — ABNORMAL HIGH (ref 0.76–1.27)
GFR calc Af Amer: 52 mL/min/{1.73_m2} — ABNORMAL LOW (ref >59)
GFR calc non Af Amer: 45 mL/min/{1.73_m2} — ABNORMAL LOW (ref >59)
Globulin, Total: 2.4 g/dL (ref 1.5–4.5)
Glucose: 75 mg/dL (ref 65–99)
Potassium: 4.3 mmol/L (ref 3.5–5.2)
Sodium: 141 mmol/L (ref 134–144)
Total Protein: 6.8 g/dL (ref 6.0–8.5)

## 2018-09-22 LAB — CBC WITH DIFFERENTIAL/PLATELET
Basophils Absolute: 0 10*3/uL (ref 0.0–0.2)
Basos: 0 %
EOS (ABSOLUTE): 0.1 10*3/uL (ref 0.0–0.4)
Eos: 1 %
Hematocrit: 47.3 % (ref 37.5–51.0)
Hemoglobin: 16.1 g/dL (ref 13.0–17.7)
Immature Grans (Abs): 0 10*3/uL (ref 0.0–0.1)
Immature Granulocytes: 1 %
Lymphocytes Absolute: 3.3 10*3/uL — ABNORMAL HIGH (ref 0.7–3.1)
Lymphs: 54 %
MCH: 29.8 pg (ref 26.6–33.0)
MCHC: 34 g/dL (ref 31.5–35.7)
MCV: 88 fL (ref 79–97)
Monocytes Absolute: 0.5 10*3/uL (ref 0.1–0.9)
Monocytes: 8 %
Neutrophils Absolute: 2.2 10*3/uL (ref 1.4–7.0)
Neutrophils: 36 %
Platelets: 138 10*3/uL — ABNORMAL LOW (ref 150–450)
RBC: 5.4 x10E6/uL (ref 4.14–5.80)
RDW: 14 % (ref 11.6–15.4)
WBC: 6.1 10*3/uL (ref 3.4–10.8)

## 2018-09-23 ENCOUNTER — Telehealth: Payer: Self-pay | Admitting: *Deleted

## 2018-09-23 NOTE — Telephone Encounter (Signed)
LVM informing patient his labs are stable. Left number for questions.

## 2019-02-12 HISTORY — PX: RETINAL DETACHMENT SURGERY: SHX105

## 2019-05-04 ENCOUNTER — Telehealth: Payer: Self-pay | Admitting: *Deleted

## 2019-05-04 NOTE — Telephone Encounter (Signed)
Brand topamax PA on CMM, key: [id number] - PA Case ID: [id number], [id number], failed single agent prior to being patient at [facility], been on brand since 2002 and stable. Information sent to [facility].

## 2019-05-04 NOTE — Telephone Encounter (Signed)
Brand, Topamax 100mg  tablet, use as directed, is approved for 12 months through 05/03/2020.

## 2019-05-25 NOTE — Telephone Encounter (Signed)
Janie @ [facility]  Is asking for a call back re: the needed PA for pt's TOPAMAX 100 MG tablet .  Wille Celeste can be reached at [phone]

## 2019-05-25 NOTE — Telephone Encounter (Signed)
Pt wife called to advise pharmacy has not received PA and pt is down to 2 pills of the topamax

## 2019-05-25 NOTE — Telephone Encounter (Addendum)
Called [facility], spoke with Trula Ore who stated [facility] pharmacy was able to fill Rx with  [phone] co pay for brand product selection fee.  Topamax was approved thru 05/03/2020. She spoke with PA dept to ask why co pay is so high since medicine was approved. Jackqulyn Livings stated a 2nd PA must be done to waive product selection fee. Marked as urgent, decision in 24 hours via fax. Pending case # [id number].

## 2019-05-25 NOTE — Telephone Encounter (Addendum)
LVM for patient advising him of 2nd PA in process, decision in 24 hours. Called [facility] pharmacy, LVM requesting call back from [place].  Received fax of approval for brand Topamax 100 mg for cost reduction through 05/03/20. Approval letter faxed to [facility].

## 2019-06-21 ENCOUNTER — Other Ambulatory Visit: Payer: Self-pay

## 2019-06-21 ENCOUNTER — Encounter: Payer: Self-pay | Admitting: Diagnostic Neuroimaging

## 2019-06-21 ENCOUNTER — Ambulatory Visit (INDEPENDENT_AMBULATORY_CARE_PROVIDER_SITE_OTHER): Payer: PRIVATE HEALTH INSURANCE | Admitting: Diagnostic Neuroimaging

## 2019-06-21 VITALS — BP 135/81 | HR 49 | Temp 97.0°F | Wt 212.2 lb

## 2019-06-21 DIAGNOSIS — G40909 Epilepsy, unspecified, not intractable, without status epilepticus: Secondary | ICD-10-CM

## 2019-06-21 DIAGNOSIS — R251 Tremor, unspecified: Secondary | ICD-10-CM

## 2019-06-21 MED ORDER — TOPAMAX 100 MG PO TABS: 100.0000 mg | ORAL_TABLET | Freq: Two times a day (BID) | ORAL | 3 refills | Status: DC

## 2019-06-21 MED ORDER — DEPAKOTE ER 500 MG PO TB24: 1500.0000 mg | ORAL_TABLET | Freq: Every day | ORAL | 3 refills | Status: DC

## 2019-06-21 NOTE — Progress Notes (Signed)
Chief Complaint  Patient presents with  . Seizures    rm 7, 9 month FU  "no seizure activity"     History of Present Illness:  UPDATE (06/21/19, VRP): Since last visit, doing well. Symptoms are stable. No seizures. No alleviating or aggravating factors. Tolerating meds. Teaching at [employer].     UPDATE (09/21/18, VRP): Since last visit, doing well. No seizures. No alleviating or aggravating factors. Tolerating meds.     UPDATE 09/16/16: Since last visit, doing well. No sz. Tolerating meds. Has a physical exam with PCP coming up (in 1-2 months).   UPDATE 09/13/15: Since last visit doing well. No sz. No blood tests since last visit. Tremor stable.   UPDATE 10/12/14: Since last visit, doing well. No sz. Tolerating lower depakote dose.   UPDATE 07/12/14: Since last visit, doing about the same. Plt levels still low (80). No seizures. No tremors.   UPDATE 10/26/13: Since last visit, tremors have resolved. Now on lower dose of depakote ER. Continues on TPX. No seizures. Doing well.   UPDATE 03/11/13: Since last visit, has been having more tremors, with handwriting and eating. This fluctuates. No clear pattern. 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   UPDATE 12/15/12: Since last visit patient is doing well. No further seizures. Last seizure was in July 2010. Patient is on Depakote ER 500 mg twice a day plus Depakote ER 250mg  at bedtime. He is also on Topamax 100 mg twice a day. Patient continues with fatigue, daytime sleepiness, bilateral upper extremity tremor.  PRIOR HPI (03/31/12, Dr. 04/02/12): 54 year old right-handed African American married male from [place], [place] with a history of early morning nocturnal generalized major motor seizures beginning in April 1994. At that time his neurologic examination, sleep deprived EEGs, and  MRI study of brain were normal. After having 2 seizures and normal EEGs he was placed  on Dilantin medication but developed elevated liver function tests and in May of 1996 was changed to Depakote. His last CT 10/31/1997 was normal. 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 His weight ballooned on Depakote, 45 pounds to 211 pounds, then back down to 189 and now 211.5 pounds. He had episodes suggestive of myoclonic jerks. In 2002 he was begun on Topamax in addition to the Depakote. He has had generalized major motor seizures, 2 in 1994, one 11/04/1996, one 02/10/2000, one 03/18/2000,  one 2008, and one 09/07/2008. On Topamax he developed elevated creatinine and was seen by Dr. 09/09/2008 nephrologist. His last MRI study of the brain 04/04/2000  was normal with and without contrast. His last DEXA scan  09/12/2005  was normal. Blood studies 09/21/10=  WBC 7600, Hemoglobin 15.5, PLTC 91 K, sodium 141, potassium 4.7, chloride 111, CO2 28, BUN 16, Creatinine 1.6. Glucose 78, Valproic acid level 114.2, TSH 6.83. Repeat TSH 11/02/10, 3.08. CT scan of the brain without contrast was normal 09/07/2008. Medication schedule is Depakote ER brand name 500 mg 2 twice daily and  Depakote ER 250 mg one in the evening,Topamax brand name 100  milligrams one twice daily, and Synthroid 25 mcg once daily. Dr.Coladonato  does not feel his elevated creatinine is due to Topamax. patient denies any new symptoms. He denies dj vu, memory loss, hallucinations, delusions, depression, anxiety, or apathy . He does not exercise on a regular basis. Last blood levels 07/24/2011 were topiramate 7.6 and valproic acid 128. 09/19/2011  CBC and CMP  were normal except platelet count 102K. He snores at night and he underwent polysomnogram 9/10/200  which was normal.    Observations/Objective:  GENERAL EXAM/CONSTITUTIONAL: Vitals:  Vitals:   06/21/19 1604  BP: 135/81  Pulse: (!) 49  Temp: (!) 97 F (36.1 C)  Weight: 212 lb 3.2 oz (96.3 kg)   Body mass index is 34.25 kg/m. Wt Readings from Last 3 Encounters:  06/21/19 212 lb  3.2 oz (96.3 kg)  09/21/18 218 lb (98.9 kg)  09/17/17 207 lb (93.9 kg)    Patient is in no distress; well developed, nourished and groomed; neck is supple  CARDIOVASCULAR:  Examination of carotid arteries is normal; no carotid bruits  Regular rate and rhythm, no murmurs  Examination of peripheral vascular system by observation and palpation is normal  EYES:  Ophthalmoscopic exam of optic discs and posterior segments is normal; no papilledema or hemorrhages No exam data present  MUSCULOSKELETAL:  Gait, strength, tone, movements noted in Neurologic exam below  NEUROLOGIC: MENTAL STATUS:  No flowsheet data found.  awake, alert, oriented to person, place and time  recent and remote memory intact  normal attention and concentration  language fluent, comprehension intact, naming intact  fund of knowledge appropriate  CRANIAL NERVE:   2nd - no papilledema on fundoscopic exam  2nd, 3rd, 4th, 6th - pupils equal and reactive to light, visual fields full to confrontation, extraocular muscles intact, no nystagmus  5th - facial sensation symmetric  7th - facial strength symmetric  8th - hearing intact  9th - palate elevates symmetrically, uvula midline  11th - shoulder shrug symmetric  12th - tongue protrusion midline  MOTOR:   normal bulk and tone, full strength in the BUE, BLE  SENSORY:   normal and symmetric to light touch   COORDINATION:   finger-nose-finger, fine finger movements normal  REFLEXES:   deep tendon reflexes TRACE and symmetric  GAIT/STATION:   narrow based gait    Assessment and Plan:  54 y.o. 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 Doing well on Depakote ER + Topamax from seizure control standpoint. Need to monitor platelets, which could be low due to long term depakote use. Mild postural tremor is stable. Last seizure July 2010.   Dx:  1. Seizure  disorder ([place])   2. Tremor     PLAN:   SEIZURE DISORDER  - continue depakote ER 1500mg  qhs (BRAND MEDICALLY NECESSARY)  - continue TPX 100mg  BID (BRAND MEDICALLY NECESSARY)  - check labs annually (follow up mild low platelet levels; could be related to depakote, but stable for now)  Meds ordered this encounter  Medications  . DEPAKOTE ER 500 MG 24 hr tablet    Sig: Take 3 tablets (1,500 mg total) by mouth daily. BRAND MEDICALLY NECESSARY.    Dispense:  270 tablet    Refill:  3  . TOPAMAX 100 MG tablet    Sig: Take 1 tablet (100 mg total) by mouth 2 (two) times daily.    Dispense:  180 tablet    Refill:  3    BRAND MEDICALLY NECESSARY   Orders Placed This Encounter  Procedures  . CBC with Differential/Platelet  . Comprehensive metabolic panel    Follow Up Instructions:  - Return in about 1 year (around 06/20/2020).     Penni Bombard, MD 6/96/2952, 8:41 PM Certified in Neurology, Neurophysiology and Neuroimaging  [facility] [address] [place], [place]  [address] [phone]  

## 2019-06-22 ENCOUNTER — Telehealth: Payer: Self-pay | Admitting: *Deleted

## 2019-06-22 LAB — CBC WITH DIFFERENTIAL/PLATELET
Basophils Absolute: 0 10*3/uL (ref 0.0–0.2)
Basos: 0 %
EOS (ABSOLUTE): 0 10*3/uL (ref 0.0–0.4)
Eos: 0 %
Hematocrit: 46.1 % (ref 37.5–51.0)
Hemoglobin: 15.8 g/dL (ref 13.0–17.7)
Immature Grans (Abs): 0 10*3/uL (ref 0.0–0.1)
Immature Granulocytes: 0 %
Lymphocytes Absolute: 3.6 10*3/uL — ABNORMAL HIGH (ref 0.7–3.1)
Lymphs: 60 %
MCH: 30 pg (ref 26.6–33.0)
MCHC: 34.3 g/dL (ref 31.5–35.7)
MCV: 88 fL (ref 79–97)
Monocytes Absolute: 0.4 10*3/uL (ref 0.1–0.9)
Monocytes: 6 %
Neutrophils Absolute: 2.1 10*3/uL (ref 1.4–7.0)
Neutrophils: 34 %
Platelets: 122 10*3/uL — ABNORMAL LOW (ref 150–450)
RBC: 5.27 x10E6/uL (ref 4.14–5.80)
RDW: 13.3 % (ref 11.6–15.4)
WBC: 6 10*3/uL (ref 3.4–10.8)

## 2019-06-22 LAB — COMPREHENSIVE METABOLIC PANEL
ALT: 14 IU/L (ref 0–44)
AST: 20 IU/L (ref 0–40)
Albumin/Globulin Ratio: 1.7 (ref 1.2–2.2)
Albumin: 4.3 g/dL (ref 3.8–4.9)
Alkaline Phosphatase: 72 IU/L (ref 39–117)
BUN/Creatinine Ratio: 10 (ref 9–20)
BUN: 17 mg/dL (ref 6–24)
Bilirubin Total: 0.3 mg/dL (ref 0.0–1.2)
CO2: 21 mmol/L (ref 20–29)
Calcium: 9.2 mg/dL (ref 8.7–10.2)
Chloride: 111 mmol/L — ABNORMAL HIGH (ref 96–106)
Creatinine, Ser: 1.63 mg/dL — ABNORMAL HIGH (ref 0.76–1.27)
GFR calc Af Amer: 54 mL/min/{1.73_m2} — ABNORMAL LOW (ref >59)
GFR calc non Af Amer: 47 mL/min/{1.73_m2} — ABNORMAL LOW (ref >59)
Globulin, Total: 2.5 g/dL (ref 1.5–4.5)
Glucose: 90 mg/dL (ref 65–99)
Potassium: 3.9 mmol/L (ref 3.5–5.2)
Sodium: 145 mmol/L — ABNORMAL HIGH (ref 134–144)
Total Protein: 6.8 g/dL (ref 6.0–8.5)

## 2019-06-22 NOTE — Telephone Encounter (Signed)
Pt wife called back for results

## 2019-06-22 NOTE — Telephone Encounter (Signed)
LVM requesting call back for results. 

## 2019-06-22 NOTE — Telephone Encounter (Signed)
Attempted to reach patient; left VM.

## 2019-06-23 NOTE — Telephone Encounter (Signed)
Pt's wife called again for lab results. She states she is on her way to work so if she does not answer you may leave a detailed message on her VM.

## 2019-06-23 NOTE — Telephone Encounter (Addendum)
LVM as wife requested, re: husband's labs showed stable labs. Platelets are still low but stable, and his kidney function is  Stable.  Left # for questions.

## 2019-08-10 ENCOUNTER — Telehealth: Payer: Self-pay | Admitting: Diagnostic Neuroimaging

## 2019-08-10 NOTE — Telephone Encounter (Addendum)
Depakote ER 500 mg tabs PA, key: [id number], G43.909, failed Dilantin, on Depakote ER BN since 1996, last seizure July 2010. [facility] is reviewing your PA request. Typically an electronic response will be received within 72 hours.

## 2019-08-10 NOTE — Telephone Encounter (Signed)
Pt's wife has called to report that a PA is needed on pt's DEPAKOTE ER 500 MG 24 hr tablet

## 2019-08-10 NOTE — Telephone Encounter (Addendum)
Depakote ER approved per [facility] through 08/09/2020. Approval letter faxed to [facility] outpt pharmacy. Called wife, LVM of approval.

## 2019-08-12 NOTE — Telephone Encounter (Signed)
Janie from [facility] pharmacy called to report that even with the Pa approval and manufacturer discount card the depakote is still over $400. She reports that in the past the doctor's office has had to call pt's insurance to do a DAW review. She is asking [place] to try this. To reach Janie in the outpatient mail order pharmacy you can call [phone] if needed. Wille Celeste also mentioned that the pt's wife has been in touch with the pharmacy and may have more information.

## 2019-08-12 NOTE — Telephone Encounter (Addendum)
I called the pt's wife and discussed message from Dr. Marjory Lies. The wife will try to contact the pharmacy and see if she can fill a week's worth. She understood we will try to get a cost reduction DAW review by insurance.   I called [facility] and spoke with Rosalita Chessman. I requested a DAW review and to have the cost of Depakote 500 mg ER BRAND NAME reduced. She stated this would be a tier exemption (tier cost reduction) request and I completed this over the phone. They are aware the pt has been on Depakote brand since 1996 per records, no seizures since 2010, well controlled. [id number]. I was told this will take 4 days to process and we will be notified of determination via fax. I updated the pt's wife. She stated the pharmacy is working on filling a week supply. I suggested she call them Monday to see if they can process the Depakote again and see if the cost has reduced. She verbalized appreciation.    I called [facility] outpatient pharmacy and spoke with a staff member and provided update that request for tier exemption is under review. He stated they are working on a 2 week supply for the pt and he verbalized appreciation for the update.

## 2019-08-12 NOTE — Telephone Encounter (Signed)
Pt's wife has called called to report what she was told by [place].  Wife was advised of the information documented 2 mins. Before her call, wife just wanted it noted that pt has a few pills remaining she just doesn't want him to run out.  Wife was told the chart would be noted of her call.

## 2019-08-12 NOTE — Telephone Encounter (Signed)
Spoke with Dr. Marjory Lies. We can try to call insurance to see if they can review DAW but  see if pt can fill a week's worth as this may take some time. Office is also closed this coming Monday.

## 2019-08-17 NOTE — Telephone Encounter (Signed)
We received a fax from [facility] that states the [id number] has been canceled for Depakote ER 500 mg for the following reason: [facility] does not review tier lowering exemption requests under the member's benefit. Please have the member contact member services for further assistance.

## 2019-08-18 NOTE — Telephone Encounter (Signed)
I called the pt's wife and LVM asking for call back so I can give her the message below from [facility].

## 2019-08-19 NOTE — Telephone Encounter (Addendum)
Received call from [place], [facility] pharmacy who stated wife called her about BN Depakote Rx. Currently his cost for 30 days is ~ $100, and last year it was $5 with tier lowering exception. I advised I'll call wife.  Called [facility], spoke with Misty Stanley who gave phone, fax # for [employer]. [phone], p [phone]. Faxed Appeal letter, last office note, supporting documents, [facility] cancellation fax. Called wife, Rosey Bath and advised her a 2nd appeal has been sent in. He has 2 weeks of medication. I advised will let them know when we get decision. She verbalized understanding, appreciation.

## 2019-08-24 NOTE — Telephone Encounter (Signed)
Received fax from [facility] asking for additional information. Form completed, faxed to [facility] [phone].

## 2019-08-24 NOTE — Telephone Encounter (Signed)
Called [facility] , appeals dept, spoke with Victorino Dike who stated Depakote ER Med approved but appeal for cost reduction not approved because there was no denial on file for cost reduction. She stated that now there is a denial for cost reduction from appeal I sent in,  so she will begin appeal of denial. Ref # app [id number]. Decision within 72 hours. If further information needed, [facility] will reach out via phone or fax.

## 2019-08-25 NOTE — Telephone Encounter (Signed)
Received fax from [facility]: Depakote ER 500 mg brand name approved through 08/09/2020. approval letter faxed to [facility] pharmacy. This did not state it is approval for cost reduction which was appeal submitted yesterday.

## 2019-08-26 NOTE — Telephone Encounter (Signed)
Received fax from [facility], BN Depakote ER approved for cost reduction until 08/09/2020.Marland Kitchen Approval letter faxed to pharmacy.

## 2020-02-21 ENCOUNTER — Other Ambulatory Visit: Payer: Self-pay | Admitting: *Deleted

## 2020-02-21 ENCOUNTER — Other Ambulatory Visit: Payer: Self-pay | Admitting: Diagnostic Neuroimaging

## 2020-02-21 DIAGNOSIS — G40909 Epilepsy, unspecified, not intractable, without status epilepticus: Secondary | ICD-10-CM

## 2020-02-21 MED ORDER — DEPAKOTE ER 500 MG PO TB24: 1500.0000 mg | ORAL_TABLET | Freq: Every day | ORAL | 2 refills | Status: DC

## 2020-04-10 ENCOUNTER — Telehealth: Payer: Self-pay | Admitting: *Deleted

## 2020-04-10 NOTE — Telephone Encounter (Signed)
Topamax 100 mg brand PA, g40.909, Key: [id number]. On brand topamax since 2002. Your information has been sent to [facility].

## 2020-04-12 NOTE — Telephone Encounter (Signed)
Brand Topamax approved  04/10/20 -04/10/2021.

## 2020-05-01 ENCOUNTER — Telehealth: Payer: Self-pay | Admitting: Diagnostic Neuroimaging

## 2020-05-01 DIAGNOSIS — G40909 Epilepsy, unspecified, not intractable, without status epilepticus: Secondary | ICD-10-CM

## 2020-05-01 MED ORDER — TOPAMAX 100 MG PO TABS: 100.0000 mg | ORAL_TABLET | Freq: Two times a day (BID) | ORAL | 3 refills | Status: DC

## 2020-05-01 NOTE — Addendum Note (Signed)
Addended by: [place] Pink on: 05/01/2020 04:34 PM   Modules accepted: Orders

## 2020-05-01 NOTE — Telephone Encounter (Addendum)
Pt is requesting a refill for TOPAMAX 100 MG tablet.  Pharmacy: [facility] Wife is asking for a call from [place], [place]

## 2020-05-08 ENCOUNTER — Telehealth: Payer: Self-pay | Admitting: Diagnostic Neuroimaging

## 2020-05-08 NOTE — Telephone Encounter (Signed)
Received form from [employer] for tier cost sharing request form. Clinical questions answered, faxed to [facility] with last office note and letter supporting request.

## 2020-05-08 NOTE — Telephone Encounter (Signed)
Pt's wife has called to report that [phone] needs to be called by Wynelle Cleveland  For a Co Pay reduction Auth for pt's Topomax which is still ringing up for $1800

## 2020-05-08 NOTE — Telephone Encounter (Signed)
PA for brand name topapmax has been approved  April 12, 2020  Mary[place] Pink, RN  2:06 PM Note   Brand Topamax approved  04/10/20 -04/10/2021.      Will fwd to [facility] to further investigate may need a tier exception?

## 2020-05-16 NOTE — Telephone Encounter (Signed)
Called [facility] to check status of topamax brand tier cost request, spoke with Jacques Navy  Request was canceled by pharmacist. She stated his plan doesn't allow for  tier cost exception, but it does allow for DAW review but no fee applies to this particular claim per pharmacist. Call back in 20 minutes to speak with pharmacist.

## 2020-05-16 NOTE — Telephone Encounter (Signed)
Called [facility], spoke with pharmacist, [place] who stated Rx is going through for $60 co pay. Next fill for brand is $30/ 30 day supply. PA approved for brand medication  until 04/10/2021.  (Pharmacist # [phone]) Called wife, LVM informing her.

## 2020-06-26 ENCOUNTER — Ambulatory Visit: Payer: PRIVATE HEALTH INSURANCE | Admitting: Diagnostic Neuroimaging

## 2020-06-26 ENCOUNTER — Telehealth: Payer: Self-pay | Admitting: Diagnostic Neuroimaging

## 2020-06-26 NOTE — Telephone Encounter (Signed)
Pt called, need to cancel my appt for today due to potential storm. Transferred Pt to Billing.

## 2020-06-26 NOTE — Telephone Encounter (Signed)
Noted  

## 2020-07-11 ENCOUNTER — Telehealth: Payer: Self-pay | Admitting: *Deleted

## 2020-07-11 NOTE — Telephone Encounter (Addendum)
Depakote ER PA. Key: [id number], has been on brand since 1996, last seizure 2010. Failed divalproex, dilantin, topiramate. [facility] is reviewing your PA request. Typically an electronic response will be received within 24-72 hours.

## 2020-07-12 NOTE — Telephone Encounter (Signed)
Depakote ER 500 mg tab approved 07/11/20-07/11/21, [id number]. Approval letter faxed to pharmacy.

## 2020-08-11 ENCOUNTER — Telehealth: Payer: Self-pay | Admitting: Diagnostic Neuroimaging

## 2020-08-11 ENCOUNTER — Other Ambulatory Visit: Payer: Self-pay | Admitting: Neurology

## 2020-08-11 DIAGNOSIS — G40909 Epilepsy, unspecified, not intractable, without status epilepticus: Secondary | ICD-10-CM

## 2020-08-11 MED ORDER — TOPAMAX 100 MG PO TABS
100.0000 mg | ORAL_TABLET | Freq: Two times a day (BID) | ORAL | 3 refills | Status: DC
Start: 2020-08-11 — End: 2020-09-25

## 2020-08-11 MED ORDER — DEPAKOTE ER 500 MG PO TB24: 1500.0000 mg | ORAL_TABLET | Freq: Every day | ORAL | 2 refills | Status: DC

## 2020-08-11 NOTE — Telephone Encounter (Signed)
Pt's wife Charles Powers request refill DEPAKOTE ER 500 MG 24 hr tablet and TOPAMAX 100 MG tablet. Would like a call back when refill has been sent. He is out of medication.

## 2020-08-11 NOTE — Progress Notes (Signed)
I called his wife back to let him know medications were sent in

## 2020-08-17 NOTE — Telephone Encounter (Signed)
Pts wife Rosey Bath called stating pharmacy is trying to charge $1800 for the TOPAMAX and $300 for the DEPAKOTE. Rosey Bath says a PA needs to be done in order to get a copay reduction. Please give wife a call on her cell.

## 2020-08-17 NOTE — Telephone Encounter (Signed)
Called pharmacy, spoke with Trinna Post who stated the last refill of Depakote for 90 day supply with co pay assistance card was $5 and topamax for 90 day supply was $4 with coupon. He is holding 90 day supply refills of each, depakote cost is $1800 and topamax is $325. Advised the cost reduction needs to be done again before he dispenses meds.  Called [facility] prescriber line, spoke with Elmo to initiate PA on brand topamax and brand depakote.  I advised him of increase in cost. He started Topamax tier cost reduction PA, will get decision in 4 days via fax. PA- [id number]. Started Depakote brand PA for tier cost reduction , [id number]. Turn around 4 days, receive decision via fax.

## 2020-08-17 NOTE — Telephone Encounter (Signed)
Called patient's wife, informed her PA's in process. She stated he has enough depakote to last but will run out of topamax tomorrow. I advised they call pharmacy, talk with Trinna Post and see if Rx will go through for cost reduction. If not maybe Trinna Post can give small bridge until deciosn. She verbalized understanding, appreciation.

## 2020-08-21 NOTE — Telephone Encounter (Addendum)
Appeal  letters faxed to [facility] marked urgent.  Called wife to inform her of status. They picked up medication to last until Thurs. I advised her if no decision by Wed I'll call [facility]. Rosey Bath verbalized understanding, appreciation.

## 2020-08-21 NOTE — Telephone Encounter (Addendum)
Topamax brand cost reduction denied: Per your health plan's criteria, this drug is covered at a cost reduction if you meet the following: You have tried two generic drugs that are equal to this drug from different drug companies. The information provided does not show that you meet the criteria listed above.  Depakote brand cost reduction denied: Per your health plan's criteria, this drug is covered at a cost reduction if you meet the following: (1) You have tried two generic drugs that are equal to this drug from different drug companies. (2) One of the following: (A) You have had an allergic reaction or did not respond well to an inactive ingredient. (B) You have had a poor response to the generic drugs that are equal to this drug. The information provided does not show that you meet the criteria listed above.  Appeal letters composed, on MD desk for review, signatures.

## 2020-08-22 NOTE — Telephone Encounter (Signed)
Called [facility], spoke with Dermika and advised topamax is continuation of therapy since 2002. She asked if he tried  two generic equivalents of Topamax from different manufacturers.  I advised he tried dilantin in past per records. She will send to pharmacy for review, decision by 08/24/20 via fax or call.

## 2020-08-22 NOTE — Telephone Encounter (Signed)
[facility] [employer]) called, regarding appeal for Topamax. Does patient have continuation of this medication in the past? Would like a call from the nurse.  Contact info: [phone]

## 2020-08-22 NOTE — Telephone Encounter (Signed)
Received fax from [facility] asking if patient has tried/failed two generic equivalents of Topamax from different manufacturers.  Called patient LVM requesting call  back to discuss all seizure medications he has tried in past.

## 2020-08-22 NOTE — Telephone Encounter (Signed)
Wife called back, stated he has only tried dilantin, no other anti seizure meds tried. I updated her on status of PAs. Rosey Bath verbalized understanding, appreciation.

## 2020-08-22 NOTE — Telephone Encounter (Signed)
Have received additional questions on both Topamax and Depakote PAs. Questions completed, faxed to [facility].

## 2020-08-24 NOTE — Telephone Encounter (Signed)
Received call from [place] who stated she picked up both Topamax and Depakote today with cost reduction.

## 2020-08-24 NOTE — Telephone Encounter (Signed)
Topamax approved for cost reduction through 08/23/2021. Ref # [id number]. Approval letter faxed to pharmacy, received confirmation.

## 2020-09-25 ENCOUNTER — Encounter: Payer: Self-pay | Admitting: Diagnostic Neuroimaging

## 2020-09-25 ENCOUNTER — Other Ambulatory Visit: Payer: Self-pay

## 2020-09-25 ENCOUNTER — Ambulatory Visit: Payer: No Typology Code available for payment source | Admitting: Diagnostic Neuroimaging

## 2020-09-25 VITALS — BP 120/72 | HR 81 | Ht 66.0 in | Wt 217.2 lb

## 2020-09-25 DIAGNOSIS — G40909 Epilepsy, unspecified, not intractable, without status epilepticus: Secondary | ICD-10-CM | POA: Diagnosis not present

## 2020-09-25 DIAGNOSIS — R251 Tremor, unspecified: Secondary | ICD-10-CM | POA: Diagnosis not present

## 2020-09-25 MED ORDER — DEPAKOTE ER 500 MG PO TB24: 1500.0000 mg | ORAL_TABLET | Freq: Every evening | ORAL | 4 refills | Status: DC

## 2020-09-25 MED ORDER — TOPAMAX 100 MG PO TABS: 100.0000 mg | ORAL_TABLET | Freq: Two times a day (BID) | ORAL | 3 refills | Status: DC

## 2020-09-25 NOTE — Progress Notes (Signed)
Chief Complaint  Patient presents with   Seizures    Rm 6, one year FU, wife- Rosey Bath "doing well, no seizures"     History of Present Illness:  UPDATE (09/25/20, VRP): Since last visit, doing well. No seizures. Tolerating meds. Tremors are minimal.   UPDATE (06/21/19, VRP): Since last visit, doing well. Symptoms are stable. No seizures. No alleviating or aggravating factors. Tolerating meds. Teaching at [employer].     UPDATE (09/21/18, VRP): Since last visit, doing well. No seizures. No alleviating or aggravating factors. Tolerating meds.     UPDATE 09/16/16: Since last visit, doing well. No sz. Tolerating meds. Has a physical exam with PCP coming up (in 1-2 months).    UPDATE 09/13/15: Since last visit doing well. No sz. No blood tests since last visit. Tremor stable.   UPDATE 10/12/14: Since last visit, doing well. No sz. Tolerating lower depakote dose.    UPDATE 07/12/14: Since last visit, doing about the same. Plt levels still low (80). No seizures. No tremors.    UPDATE 10/26/13: Since last visit, tremors have resolved. Now on lower dose of depakote ER. Continues on TPX. No seizures. Doing well.    UPDATE 03/11/13: Since last visit, has been having more tremors, with handwriting and eating. This fluctuates. No clear pattern. 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    UPDATE 12/15/12: Since last visit patient is doing well. No further seizures. Last seizure was in July 2010. Patient is on Depakote ER 500 mg twice a day plus Depakote ER 250mg  at bedtime. He is also on Topamax 100 mg twice a day. Patient continues with fatigue, daytime sleepiness, bilateral upper extremity tremor.  PRIOR HPI (03/31/12, Dr. 04/02/12): 55 year old right-handed African American married male from [place], [place] with a history of early morning nocturnal generalized major motor seizures beginning in April 1994. At that time his neurologic  examination, sleep deprived EEGs, and  MRI study of brain were normal. After having 2 seizures and normal EEGs he was placed on Dilantin medication but developed elevated liver function tests and in May of 1996 was changed to Depakote. His last CT 10/31/1997 was normal. 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 His weight ballooned on Depakote, 45 pounds to 211 pounds, then back down to 189 and now 211.5 pounds. He had episodes suggestive of myoclonic jerks. In 2002 he was begun on Topamax in addition to the Depakote. He has had generalized major motor seizures, 2 in 1994, one 11/04/1996, one 02/10/2000, one 03/18/2000,  one 2008, and one 09/07/2008. On Topamax he developed elevated creatinine and was seen by Dr. 09/09/2008 nephrologist. His last MRI study of the brain 04/04/2000  was normal with and without contrast. His last DEXA scan  09/12/2005  was normal. Blood studies 09/21/10=  WBC 7600, Hemoglobin 15.5, PLTC 91 K, sodium 141, potassium 4.7, chloride 111, CO2 28, BUN 16, Creatinine 1.6. Glucose 78, Valproic acid level 114.2, TSH 6.83. Repeat TSH 11/02/10, 3.08. CT scan of the brain without contrast was normal 09/07/2008. Medication schedule is Depakote ER brand name 500 mg 2 twice daily and  Depakote ER 250 mg one in the evening,Topamax brand name 100  milligrams one twice daily, and Synthroid 25 mcg once daily. Dr.Coladonato  does not feel his elevated creatinine is due to Topamax. patient denies any new symptoms. He denies dj vu, memory loss, hallucinations, delusions, depression, anxiety, or apathy .  He does not exercise on a regular basis. Last blood levels 07/24/2011 were topiramate 7.6 and valproic acid 128. 09/19/2011  CBC and CMP were normal except platelet count 102K. He snores at night and he underwent polysomnogram 9/10/200  which was normal.    Observations/Objective:  GENERAL EXAM/CONSTITUTIONAL: Vitals:  Vitals:   09/25/20 1608  BP: 120/72  Pulse: 81  Weight: 217 lb 3.2 oz (98.5 kg)   Height: 5\' 6"  (1.676 m)   Body mass index is 35.06 kg/m. Wt Readings from Last 3 Encounters:  09/25/20 217 lb 3.2 oz (98.5 kg)  06/21/19 212 lb 3.2 oz (96.3 kg)  09/21/18 218 lb (98.9 kg)   Patient is in no distress; well developed, nourished and groomed; neck is supple  CARDIOVASCULAR: Examination of carotid arteries is normal; no carotid bruits Regular rate and rhythm, no murmurs Examination of peripheral vascular system by observation and palpation is normal  EYES: Ophthalmoscopic exam of optic discs and posterior segments is normal; no papilledema or hemorrhages No results found.  MUSCULOSKELETAL: Gait, strength, tone, movements noted in Neurologic exam below  NEUROLOGIC: MENTAL STATUS:  No flowsheet data found. awake, alert, oriented to person, place and time recent and remote memory intact normal attention and concentration language fluent, comprehension intact, naming intact fund of knowledge appropriate  CRANIAL NERVE:  2nd - no papilledema on fundoscopic exam 2nd, 3rd, 4th, 6th - pupils equal and reactive to light, visual fields full to confrontation, extraocular muscles intact, no nystagmus 5th - facial sensation symmetric 7th - facial strength symmetric 8th - hearing intact 9th - palate elevates symmetrically, uvula midline 11th - shoulder shrug symmetric 12th - tongue protrusion midline  MOTOR:  normal bulk and tone, full strength in the BUE, BLE  SENSORY:  normal and symmetric to light touch   COORDINATION:  finger-nose-finger, fine finger movements normal  REFLEXES:  deep tendon reflexes TRACE and symmetric  GAIT/STATION:  narrow based gait    Assessment and Plan:  55 y.o. 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 Doing well on Depakote ER + Topamax from seizure control standpoint. Need to monitor platelets, which could be low due to long term depakote use. Mild  postural tremor is stable. Last seizure July 2010.    Dx:  1. Seizure disorder (HCC)   2. Tremor      PLAN:   SEIZURE DISORDER  - continue depakote ER 1500mg  qhs (BRAND MEDICALLY NECESSARY)  - continue TPX 100mg  BID (BRAND MEDICALLY NECESSARY)  - check labs annually (follow up mild low platelet levels; could be related to depakote, but stable for now)  Meds ordered this encounter  Medications   DEPAKOTE ER 500 MG 24 hr tablet    Sig: Take 3 tablets (1,500 mg total) by mouth every evening. BRAND MEDICALLY NECESSARY.    Dispense:  270 tablet    Refill:  4   TOPAMAX 100 MG tablet    Sig: Take 1 tablet (100 mg total) by mouth 2 (two) times daily.    Dispense:  180 tablet    Refill:  3    BRAND MEDICALLY NECESSARY    Follow Up Instructions:  - Return in about 1 year (around 09/25/2021) for with NP (Amy Lomax), virtual visit (15 min).     , MD 09/25/2020, 4:53 PM Certified in Neurology, Neurophysiology and Neuroimaging  [facility] [address] [place], [facility] [address] [phone]

## 2020-11-30 ENCOUNTER — Telehealth: Payer: Self-pay | Admitting: Diagnostic Neuroimaging

## 2020-11-30 NOTE — Telephone Encounter (Signed)
Pt's wife is asking for a call from Pincus Sanes, RN when she returns on Monday of next week to discuss pt's DEPAKOTE ER 500 MG 24 hr tablet & TOPAMAX 100 MG tablet

## 2020-11-30 NOTE — Telephone Encounter (Signed)
I called pt's wife back and left a vm asking for a call back.  I advised I would be happy to talk with her during [facility] absence.

## 2021-01-12 ENCOUNTER — Telehealth: Payer: Self-pay | Admitting: Diagnostic Neuroimaging

## 2021-01-12 NOTE — Telephone Encounter (Signed)
We do not manage runny nose in the office, advise to call PCP.

## 2021-01-12 NOTE — Telephone Encounter (Signed)
Pt's wife is wanting to know what pt can take for a runny nose, he has had it since yesterday.

## 2021-01-12 NOTE — Telephone Encounter (Signed)
Wife was called back, wife was advised that PCP is aware of all medications and history and can still can advise.  Wife was appreciative of call.  FYI no call back requested.

## 2021-03-05 ENCOUNTER — Other Ambulatory Visit: Payer: Self-pay | Admitting: Nephrology

## 2021-03-05 DIAGNOSIS — N1831 Chronic kidney disease, stage 3a: Secondary | ICD-10-CM

## 2021-05-19 ENCOUNTER — Other Ambulatory Visit: Payer: Self-pay | Admitting: Neurology

## 2021-05-19 DIAGNOSIS — G40909 Epilepsy, unspecified, not intractable, without status epilepticus: Secondary | ICD-10-CM

## 2021-05-21 ENCOUNTER — Other Ambulatory Visit: Payer: Self-pay | Admitting: Neurology

## 2021-05-21 ENCOUNTER — Telehealth: Payer: Self-pay | Admitting: Diagnostic Neuroimaging

## 2021-05-21 DIAGNOSIS — G40909 Epilepsy, unspecified, not intractable, without status epilepticus: Secondary | ICD-10-CM

## 2021-05-21 NOTE — Telephone Encounter (Signed)
Pt wife Charles Powers) (no DPR on file)  states pt needs paperwork filled out for copay reduction of TOPAMAX 100 MG tablet. Copay is coming up higher than normal. Would like a call back.  ?

## 2021-05-22 MED ORDER — DEPAKOTE ER 500 MG PO TB24: 1500.0000 mg | ORAL_TABLET | Freq: Every evening | ORAL | 4 refills | Status: DC

## 2021-05-22 NOTE — Telephone Encounter (Addendum)
Received fax back from [employer] stating both tier exceptions are still on file submitted from July of 2022 and are set to expire on 07/11/2021. ?I called the help desk with [employer] back again and relayed to Carmelia Bake the pharmacy is still having a high co-pay being filed 50 $ when previously was 5 $. He spoke with one of the clinicians and was advised the copay of Depakote and Topamax has went up for the year. The tier exceptions are still on file and both meds are still being ran at tier 0. ? ?Clifton James explained in 2022 the depakote w/o insurance cost 900 but now costs 1471. Topamax in 2022 was 1999 $ w/o insurance and is no 2083. The discount cards are still correctly being used by the pharmacy because total out of pocket pocket is 50-100 $. I will discuss with [facility] tomorrow since she has had experience with this case in the past. ? ?To note both of these tier exceptions are set to expire 07/11/2021.  ?

## 2021-05-22 NOTE — Telephone Encounter (Signed)
I called the pharmacy and spoke with tech she sts this is the first fill for 2023 and new tier exception is needed to help with copay reduction. ? ?I called the pt's wife and she confirmed copay increase in meds. ? ?I called [employer] spoke with [facility] and tier reduction request was completed. ? ?Topamax ref # [id number] ?Depakote ref # [id number]. ? ?Turn around time is 24 hours.  ?

## 2021-05-23 NOTE — Telephone Encounter (Signed)
Called wife, LVM requesting call back.  ?

## 2021-05-23 NOTE — Telephone Encounter (Signed)
Pt's wife is asking for a call back from Leilani Able, RN re: theDepakote and Topamax  ?

## 2021-05-23 NOTE — Telephone Encounter (Signed)
Returned call to wife and explained conversations Megan B, RN had yesterday with insurance and pharmacy. Advised her here is not a lower tier and the manufacturer has increased cost of meds in Jan. This is not in our control; we will reapply for cost reductions in May. She then stated he was told by anesthesiologist recently that he should discuss coming off these meds, they can be dangerous over time. I advised wife he can discuss with MD at next visit. She also stated they don't want to endanger him by him coming off them. He will discuss with MD in FU. Wife verbalized understanding, appreciation. ? ?

## 2021-05-30 ENCOUNTER — Other Ambulatory Visit: Payer: 59

## 2021-06-05 ENCOUNTER — Telehealth: Payer: Self-pay | Admitting: Diagnostic Neuroimaging

## 2021-06-05 ENCOUNTER — Other Ambulatory Visit: Payer: Self-pay | Admitting: Diagnostic Neuroimaging

## 2021-06-05 ENCOUNTER — Encounter: Payer: Self-pay | Admitting: Diagnostic Neuroimaging

## 2021-06-05 DIAGNOSIS — G40909 Epilepsy, unspecified, not intractable, without status epilepticus: Secondary | ICD-10-CM

## 2021-06-05 MED ORDER — DIVALPROEX SODIUM ER 250 MG PO TB24
1500.0000 mg | ORAL_TABLET | Freq: Every day | ORAL | 2 refills | Status: DC
Start: 2021-06-05 — End: 2021-09-17

## 2021-06-05 NOTE — Telephone Encounter (Signed)
Called pharmacy, spoke with Cassandra who stated no projected release date for depakote ER brand name 500 mg, but they can get brand name depakote 250 mg. Patient would need to take 6 tablets at bedtime daily.  Called wife (on DPR dated 2014) and informed her. She then discussed concerns about patient being on depakote long term. I advised he can discuss at next follow up. She verbalized understanding, appreciation. ? ?

## 2021-06-05 NOTE — Telephone Encounter (Signed)
Pt's wife, Faheem Ziemann (do not have a DPR on file) Pharmacy said DEPAKOTE ER 500 MG 24 hr tablet is on backorder. Pharmacy suggested a prescription for the Depakote 250 mg. Would like a call from the nurse. ?

## 2021-07-16 ENCOUNTER — Telehealth: Payer: Self-pay | Admitting: *Deleted

## 2021-07-16 NOTE — Telephone Encounter (Signed)
Received e mail : documents are attached. Depakote PA sent tp plan. Your information has been sent to [facility].

## 2021-07-16 NOTE — Telephone Encounter (Signed)
Depakote ER 500 mg PA, Key: [id number], faxed office notes to be attached.

## 2021-07-17 NOTE — Telephone Encounter (Signed)
Depakote brand approved 07/16/2021 - 07/17/2022. Faxed approval letter to pharmacy.

## 2021-08-15 ENCOUNTER — Other Ambulatory Visit: Payer: Self-pay | Admitting: Neurology

## 2021-08-15 DIAGNOSIS — G40909 Epilepsy, unspecified, not intractable, without status epilepticus: Secondary | ICD-10-CM

## 2021-09-15 ENCOUNTER — Other Ambulatory Visit: Payer: Self-pay | Admitting: Diagnostic Neuroimaging

## 2021-09-24 NOTE — Patient Instructions (Signed)
Below is our plan:  We will continue Depakote 1500mg  daily and Topamax 100mg  twice daily.   Please make sure you are consistent with timing of seizure medication. I recommend annual visit with primary care provider (PCP) for complete physical and routine blood work. I recommend daily intake of vitamin D (400-800iu) and calcium (800-1000mg ) for bone health. Discuss Dexa screening with PCP.   According to [place] law, you can not drive unless you are seizure / syncope free for at least 6 months and under physician's care.  Please maintain precautions. Do not participate in activities where a loss of awareness could harm you or someone else. No swimming alone, no tub bathing, no hot tubs, no driving, no operating motorized vehicles (cars, ATVs, motocycles, etc), lawnmowers, power tools or firearms. No standing at heights, such as rooftops, ladders or stairs. Avoid hot objects such as stoves, heaters, open fires. Wear a helmet when riding a bicycle, scooter, skateboard, etc. and avoid areas of traffic. Set your water heater to 120 degrees or less.   Please make sure you are staying well hydrated. I recommend 50-60 ounces daily. Well balanced diet and regular exercise encouraged. Consistent sleep schedule with 6-8 hours recommended.   Please continue follow up with care team as directed.   Follow up with me in 1 year  You may receive a survey regarding today's visit. I encourage you to leave honest feed back as I do use this information to improve patient care. Thank you for seeing me today!

## 2021-09-24 NOTE — Progress Notes (Unsigned)
PATIENT: Charles Powers DOB: 04/25/1965  REASON FOR VISIT: follow up HISTORY FROM: patient  Virtual Visit via Telephone Note  I connected with Charles Powers on 09/25/21 at  9:15 AM EDT by telephone and verified that I am speaking with the correct person using two identifiers.   I discussed the limitations, risks, security and privacy concerns of performing an evaluation and management service by telephone and the availability of in person appointments. I also discussed with the patient that there may be a patient responsible charge related to this service. The patient expressed understanding and agreed to proceed.   History of Present Illness:  09/25/21 ALL: Charles Powers is a 56 y.o. male here today for follow up for seizures. He continues Depakote ER 1500mg  daily and Topamax 100mg  BID (BRAND MEDICALLY NECESSARY). Last seizure was in 2010. He reports doing well. Tremor stable.   Platelet count 06/02/2021 132. Creatinine 1.65.   Topiramate was filled 08/15/2021, was not called in as brand. He has tolerated well. No known breakthrough seizure on generic, however, patient has always been on brand name Depakote and Topamax and prefers to continue this regimen.   Depakote was reduced to 1500mg  in 06/2014 due to low platelet count   History (copied from Dr 10/16/2021 previous note)  UPDATE (09/25/20, VRP): Since last visit, doing well. No seizures. Tolerating meds. Tremors are minimal.    UPDATE (06/21/19, VRP): Since last visit, doing well. Symptoms are stable. No seizures. No alleviating or aggravating factors. 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      UPDATE (09/21/18, VRP): Since last visit, doing well. No seizures. No alleviating or aggravating factors. Tolerating meds.      UPDATE 09/16/16: Since last visit, doing well. No sz. Tolerating meds. Has a physical exam with PCP coming up (in 1-2 months).    UPDATE 09/13/15: Since last visit doing well. No sz. No blood tests since  last visit. Tremor stable.    UPDATE 10/12/14: Since last visit, doing well. No sz. Tolerating lower depakote dose.    UPDATE 07/12/14: Since last visit, doing about the same. Plt levels still low (80). No seizures. No tremors.    UPDATE 10/26/13: Since last visit, tremors have resolved. Now on lower dose of depakote ER. Continues on TPX. No seizures. Doing well.    UPDATE 03/11/13: Since last visit, has been having more tremors, with handwriting and eating. This fluctuates. No clear pattern. 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    UPDATE 12/15/12: Since last visit patient is doing well. No further seizures. Last seizure was in July 2010. Patient is on Depakote ER 500 mg twice a day plus Depakote ER 250mg  at bedtime. He is also on Topamax 100 mg twice a day. Patient continues with fatigue, daytime sleepiness, bilateral upper extremity tremor.   PRIOR HPI (03/31/12, Dr. 13/4/14): 56 year old right-handed African American married male from [place], 04/02/12 with a history of early morning nocturnal generalized major motor seizures beginning in April 1994. At that time his neurologic examination, sleep deprived EEGs, and  MRI study of brain were normal. After having 2 seizures and normal EEGs he was placed on Dilantin medication but developed elevated liver function tests and in May of 1996 was changed to Depakote. His last CT 10/31/1997 was normal. 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 His weight ballooned on Depakote, 45 pounds to 211 pounds, then back down to 189  and now 211.5 pounds. He had episodes suggestive of myoclonic jerks. In 2002 he was begun on Topamax in addition to the Depakote. He has had generalized major motor seizures, 2 in 1994, one 11/04/1996, one 02/10/2000, one 03/18/2000,  one 2008, and one 09/07/2008. On Topamax he developed elevated creatinine and was seen by Charles Powers nephrologist. His last MRI study of  the brain 04/04/2000  was normal with and without contrast. His last DEXA scan  09/12/2005  was normal. Blood studies 09/21/10=  WBC 7600, Hemoglobin 15.5, PLTC 91 K, sodium 141, potassium 4.7, chloride 111, CO2 28, BUN 16, Creatinine 1.6. Glucose 78, Valproic acid level 114.2, TSH 6.83. Repeat TSH 11/02/10, 3.08. CT scan of the brain without contrast was normal 09/07/2008. Medication schedule is Depakote ER brand name 500 mg 2 twice daily and  Depakote ER 250 mg one in the evening,Topamax brand name 100  milligrams one twice daily, and Synthroid 25 mcg once daily. CharlesColadonato  does not feel his elevated creatinine is due to Topamax. patient denies any new symptoms. He denies dj vu, memory loss, hallucinations, delusions, depression, anxiety, or apathy . He does not exercise on a regular basis. Last blood levels 07/24/2011 were topiramate 7.6 and valproic acid 128. 09/19/2011  CBC and CMP were normal except platelet count 102K. He snores at night and he underwent polysomnogram 9/10/200  which was normal.  Observations/Objective:  Generalized: Well developed, in no acute distress  Mentation: Alert oriented to time, place, history taking. Follows all commands speech and language fluent   Assessment and Plan:  56 y.o. year old male  has a past medical history of Elevated creatine kinase, Essential tremor, Gout, Hypothyroidism, Liver dysfunction, Seizures (HCC), and Temporary low platelet count (HCC). here with    ICD-10-CM   1. Seizure disorder (HCC)  G40.909     2. Tremor  R25.1      Charles Powers is doing well. No seizure activity since 2010. We will continue Depakote 1500mg  QD and Topamax 100mg  BID. Brand name only. He will continue close follow up with PCP for lab monitoring. He will follow up with in 1 year.     No orders of the defined types were placed in this encounter.   Meds ordered this encounter  Medications   TOPAMAX 100 MG tablet    Sig: Take 1 tablet (100 mg total) by mouth 2 (two) times  daily.    Dispense:  180 tablet    Refill:  3    Rand medically necessary, generic was filled in 08/2021, patient will pick up brand asap    Order Specific Question:   Supervising Provider    Answer:   [place] 09/2021   DEPAKOTE ER 250 MG 24 hr tablet    Sig: Take 6 tablets (1,500 mg total) by mouth daily. BRAND ONLY    Dispense:  540 tablet    Refill:  3    BRAND MEDICALLY NECESSARY    Order Specific Question:   Supervising Provider    Answer:   Anson Fret [[id number]]     Follow Up Instructions:  I discussed the assessment and treatment plan with the patient. The patient was provided an opportunity to ask questions and all were answered. The patient agreed with the plan and demonstrated an understanding of the instructions.   The patient was advised to call back or seek an in-person evaluation if the symptoms worsen or if the condition fails to improve as anticipated.  I provided  15 minutes of non-face-to-face time during this encounter. Patient located at their place of residence during [place] visit. Provider is in the office.    Debbora Presto, NP

## 2021-09-25 ENCOUNTER — Telehealth (INDEPENDENT_AMBULATORY_CARE_PROVIDER_SITE_OTHER): Payer: PRIVATE HEALTH INSURANCE | Admitting: Family Medicine

## 2021-09-25 ENCOUNTER — Encounter: Payer: Self-pay | Admitting: Family Medicine

## 2021-09-25 DIAGNOSIS — G40909 Epilepsy, unspecified, not intractable, without status epilepticus: Secondary | ICD-10-CM | POA: Diagnosis not present

## 2021-09-25 DIAGNOSIS — R251 Tremor, unspecified: Secondary | ICD-10-CM

## 2021-09-25 MED ORDER — TOPAMAX 100 MG PO TABS: 100.0000 mg | ORAL_TABLET | Freq: Two times a day (BID) | ORAL | 3 refills | Status: DC

## 2021-09-25 MED ORDER — DEPAKOTE ER 250 MG PO TB24: ORAL_TABLET | ORAL | 3 refills | Status: DC

## 2021-09-26 ENCOUNTER — Telehealth: Payer: Self-pay | Admitting: Neurology

## 2021-09-26 NOTE — Telephone Encounter (Signed)
PA submitted on [facility] DPO:[id number] Will await determination

## 2021-10-10 NOTE — Telephone Encounter (Signed)
PA approved  Request Reference Number: [id number]. TOPAMAX TAB 100MG  is approved through 09/27/2022. Your patient may now fill this prescription and it will be covered.

## 2022-10-17 ENCOUNTER — Telehealth: Payer: Self-pay | Admitting: Family Medicine

## 2022-10-17 MED ORDER — DEPAKOTE ER 250 MG PO TB24: ORAL_TABLET | ORAL | 0 refills | Status: DC

## 2022-10-17 NOTE — Telephone Encounter (Signed)
Last seen on 09/25/21 No follow up scheduled

## 2022-10-17 NOTE — Telephone Encounter (Signed)
Pt called wanting to put in the request of a refill for his  DEPAKOTE ER 250 MG 24 hr tablet and have it sent to the [facility].

## 2022-10-22 ENCOUNTER — Other Ambulatory Visit: Payer: Self-pay | Admitting: Family Medicine

## 2022-10-22 ENCOUNTER — Telehealth: Payer: Self-pay

## 2022-10-22 ENCOUNTER — Telehealth: Payer: Self-pay | Admitting: Family Medicine

## 2022-10-22 ENCOUNTER — Telehealth: Payer: Commercial Managed Care - PPO

## 2022-10-22 ENCOUNTER — Other Ambulatory Visit (HOSPITAL_COMMUNITY): Payer: Self-pay

## 2022-10-22 NOTE — Telephone Encounter (Signed)
Pharmacy Patient Advocate Encounter   Received notification from Physician's Office that prior authorization for Depakote ER 250MG  er tablets is required/requested.   Insurance verification completed.   The patient is insured through [facility] .   Per test claim: PA required; PA submitted to [facility] via CoverMyMeds Key/confirmation #/EOC [id number] Status is pending

## 2022-10-22 NOTE — Telephone Encounter (Signed)
Prior Auth team please see below

## 2022-10-22 NOTE — Telephone Encounter (Signed)
Pt has been told that a PA is needed for his DEPAKOTE ER 250 MG 24 hr tablet

## 2022-10-22 NOTE — Telephone Encounter (Signed)
PA request has been Submitted. New Encounter created for follow up. For additional info see Pharmacy Prior Auth telephone encounter from 10/22/2022.

## 2022-10-22 NOTE — Telephone Encounter (Signed)
Pt's wife called to schedule 1 yearly follow up to be able to get refill for Depakote. Informed her pt has a balance, she requesting balance for pt to be able to get medication today.

## 2022-10-23 NOTE — Telephone Encounter (Signed)
The Depakote 250 mg needs a prior authorization, which is pending as of 10/22/22.  I will forward balance information billing department regarding balance concern.

## 2022-10-24 ENCOUNTER — Telehealth: Payer: Self-pay | Admitting: Family Medicine

## 2022-10-24 NOTE — Telephone Encounter (Signed)
PA pending

## 2022-10-24 NOTE — Telephone Encounter (Signed)
REQUIRED PHONE NOTE: Wife called and scheduled pt's 1 yr f/u.  Wife also inquired about the PA for pt's DEPAKOTE . This is Charles Powers, wife only asking to be contacted when there is a response to the PA.  Pt only has a weeks worth of medication left.

## 2022-10-24 NOTE — Telephone Encounter (Signed)
PA still pending as of today.

## 2022-10-28 ENCOUNTER — Encounter: Payer: Self-pay | Admitting: *Deleted

## 2022-10-28 NOTE — Telephone Encounter (Signed)
Pharmacy Patient Advocate Encounter  Received notification from [facility] that Prior Authorization for Depakote ER 250MG  er tablets has been DENIED.  Full denial letter will be uploaded to the media tab. See denial reason below.   PA #/Case ID/Reference #: PA Case ID #: [id number]

## 2022-10-28 NOTE — Telephone Encounter (Addendum)
Appeal letter done as done in previous years.  For BN Depakote ER 250mg  tablets (plus cost reduction).  I have faxed to APPEALS about this for pt [phone] URGENT.  [phone].  Received fax confirmation.

## 2022-10-29 ENCOUNTER — Telehealth: Payer: Self-pay | Admitting: Family Medicine

## 2022-10-29 NOTE — Telephone Encounter (Signed)
Note Appeal letter done as done in previous years.  For BN Depakote ER 250mg  tablets (plus cost reduction).  I have faxed to APPEALS about this for pt [phone] URGENT.  [phone].  Received fax confirmation.

## 2022-10-29 NOTE — Telephone Encounter (Signed)
Charles Powers from [employer]  at [place] called stating that the PA was denied for the pt's DEPAKOTE ER 250 MG 24 hr tablet and he would like to know if an appeal can be put in for the pt because he is going to run out again tomorrow and they have already filled it twice with an emergency fill. Please advise. [phone]

## 2022-10-29 NOTE — Telephone Encounter (Signed)
I called and spoke to [facility], relayed that urgent appeal was done yesterday up to 72 hours for determination.  Will let him know.  This has been done for years previously.  He stated that pt has been given 40 tabs BN and is expensive. Pt responsible if not approved.

## 2022-10-29 NOTE — Telephone Encounter (Signed)
I called Charles Powers and received approval for depkaote er BN for 6 months.  No fax approval yet.  They will fax to [place].  He was able to run thru $200.00.  He will let pt know.

## 2022-10-29 NOTE — Telephone Encounter (Signed)
Ven @ [facility] has called to inform of the approval for 6 months for pt's Depakote from 10/29/22-04/28/23, fax will be sent over with approval information as well

## 2022-10-31 ENCOUNTER — Other Ambulatory Visit: Payer: Self-pay | Admitting: *Deleted

## 2022-10-31 NOTE — Telephone Encounter (Signed)
Last seen on 09/25/21 Follow up scheduled on 05/12/23   Too soon to refill next refill due in Oct. Last filled on 08/20/22 #90 day supply

## 2022-11-07 ENCOUNTER — Other Ambulatory Visit: Payer: Self-pay

## 2022-11-07 MED ORDER — TOPAMAX 100 MG PO TABS: 100.0000 mg | ORAL_TABLET | Freq: Two times a day (BID) | ORAL | 1 refills | Status: AC

## 2022-11-11 ENCOUNTER — Other Ambulatory Visit (HOSPITAL_COMMUNITY): Payer: Self-pay

## 2022-11-11 ENCOUNTER — Telehealth: Payer: Self-pay | Admitting: Family Medicine

## 2022-11-11 ENCOUNTER — Telehealth: Payer: Self-pay

## 2022-11-11 NOTE — Telephone Encounter (Signed)
Pharmacy Patient Advocate Encounter   Received notification from Physician's Office that prior authorization for Topamax 100MG  tablets is required/requested.   Insurance verification completed.   The patient is insured through [facility] .   Per test claim: PA required; PA submitted to [facility] via CoverMyMeds Key/confirmation #/EOC [id number] Status is pending

## 2022-11-11 NOTE — Telephone Encounter (Signed)
Pt's Charles Powers; Pharmacy informed need a PA for TOPAMAX 100 MG tablet Ms. Heft said pt is just about out of medication.

## 2022-11-12 NOTE — Telephone Encounter (Signed)
Pharmacy Patient Advocate Encounter  Received notification from [facility] that Prior Authorization for Topamax has been DENIED.  Full denial letter will be uploaded to the media tab. See denial reason below.    PA #/Case ID/Reference #: PA Case ID #: [id number]

## 2022-11-12 NOTE — Telephone Encounter (Signed)
Patient's wife called in and stated pharmacy told her PA was denied and she needed to contact us for [place] to do an appeal.

## 2022-11-13 NOTE — Telephone Encounter (Signed)
Pt's wife called stating that the pt is almost out of his medication and is wanting to know if an appeal is being done. Please advise.

## 2022-11-13 NOTE — Telephone Encounter (Signed)
Per AL,NP note 09/25/21: "Topiramate was filled 08/15/2021, was not called in as brand. He has tolerated well. No known breakthrough seizure on generic, however, patient has always been on brand name Depakote and Topamax and prefers to continue this regimen. "  Maxine Glenn- was this info submitted on PA request?

## 2022-11-13 NOTE — Telephone Encounter (Signed)
Charles Powers, ok thanks for explaining further. I did try and pull up the key before I messaged but it wouldn't let me into the case for some reason. Have you received the denial letter? I was going to try and appeal it

## 2022-11-14 NOTE — Telephone Encounter (Signed)
Hello, I have not seen denial for Topamax uploaded yet. Do you all have this so we can proceed with appeal? Thank you

## 2022-11-14 NOTE — Telephone Encounter (Signed)
I will save the letter and attach it to the media tab

## 2022-11-14 NOTE — Telephone Encounter (Signed)
Waiting on letter to be uploaded.

## 2022-11-18 NOTE — Telephone Encounter (Signed)
Letter has been uploaded, working on appeal

## 2022-11-18 NOTE — Telephone Encounter (Signed)
Called and LVM for wife with information. Ok per [employer].

## 2022-11-18 NOTE — Telephone Encounter (Signed)
Phone room: please call wife and let her know appeal letter faxed in today as urgent. Once we hear back from insurance on decision, we will update them.

## 2022-11-18 NOTE — Telephone Encounter (Signed)
Faxed appeal letter to [employer], marked urgent. Received fax confirmation, waiting on determination.

## 2022-11-19 ENCOUNTER — Other Ambulatory Visit: Payer: Self-pay

## 2022-11-19 NOTE — Telephone Encounter (Signed)
Contacted Charles Powers requested to be updated on the process of the appeal. Informed her we submitted answers for the questionnaire information needed for the appeal. Will received a determination in 1-2 days. She verbalized understand and appreciative for the call.

## 2022-11-19 NOTE — Telephone Encounter (Signed)
Called appeals department back and spoke w/ Larita Fife. Provided medical answers over there phone for appeal.  [phone]. Should have determination within 1-2 days.

## 2022-11-19 NOTE — Telephone Encounter (Signed)
Spoke w/ Pattricia Boss. States they will be sending fax to [phone] (pod fax)

## 2022-11-19 NOTE — Telephone Encounter (Signed)
[employer] Bonita Quin, Financial trader) Faxing over questionnaire form to NP, Amy Lomax in regards to information needed for medication. Medication was denied throught authorization process. This a second urgent review. Can complete form or call us back at  Phone: [phone] Reference no: [id number]  Have last than 24 hours to complete, need by close of office today.

## 2022-11-26 NOTE — Telephone Encounter (Addendum)
Called [employer] at [phone] to check on status of appeal. Spoke w/ Alecia Lemming. Appeal approved 11/11/22-05/20/2023. Approval case # [id number]. I called wife and LVM letting her know about update.

## 2023-01-07 ENCOUNTER — Telehealth: Payer: Self-pay | Admitting: Family Medicine

## 2023-01-07 MED ORDER — DEPAKOTE ER 250 MG PO TB24: ORAL_TABLET | ORAL | 2 refills | Status: DC

## 2023-01-07 NOTE — Telephone Encounter (Signed)
Pt's wife called stating that the pt is needing his DEPAKOTE ER 250 MG 24 hr tablet called in to the [facility]. Wife would like to be called once this is called in so that she can go pick it up.

## 2023-01-07 NOTE — Telephone Encounter (Signed)
Last seen on 09/25/21 Follow up scheduled on 05/12/23   I called [facility] to confirm pick up date, pharmacy said Rx was picked up on 10/30/22 for 76 day supply  Rx sent.

## 2023-01-07 NOTE — Telephone Encounter (Signed)
Left detailed message on voicemail Rx has been sent to pharmacy.

## 2023-04-08 ENCOUNTER — Encounter: Payer: Self-pay | Admitting: Diagnostic Neuroimaging

## 2023-04-08 NOTE — Telephone Encounter (Signed)
 Patient scheduled on 05/12/23

## 2023-04-09 MED ORDER — DEPAKOTE ER 250 MG PO TB24: ORAL_TABLET | ORAL | 0 refills | Status: AC

## 2023-05-12 ENCOUNTER — Ambulatory Visit: Payer: PRIVATE HEALTH INSURANCE | Admitting: Diagnostic Neuroimaging
